# Patient Record
Sex: Female | Born: 1935 | Race: White | Hispanic: No | Smoking: Former smoker
Health system: Southern US, Community
[De-identification: ages and names within clinical notes are randomized; demographics above are authoritative.]

## PROBLEM LIST (undated history)

## (undated) DIAGNOSIS — K219 Gastro-esophageal reflux disease without esophagitis: Secondary | ICD-10-CM

## (undated) DIAGNOSIS — E079 Disorder of thyroid, unspecified: Secondary | ICD-10-CM

## (undated) HISTORY — PX: ABDOMINAL HYSTERECTOMY: SHX81

---

## 2000-06-02 ENCOUNTER — Encounter: Payer: Self-pay | Admitting: Internal Medicine

## 2000-06-02 ENCOUNTER — Ambulatory Visit (HOSPITAL_COMMUNITY): Admission: RE | Admit: 2000-06-02 | Discharge: 2000-06-02 | Payer: Self-pay | Admitting: Internal Medicine

## 2001-01-06 ENCOUNTER — Ambulatory Visit (HOSPITAL_COMMUNITY): Admission: RE | Admit: 2001-01-06 | Discharge: 2001-01-06 | Payer: Self-pay | Admitting: Ophthalmology

## 2002-02-02 ENCOUNTER — Other Ambulatory Visit: Admission: RE | Admit: 2002-02-02 | Discharge: 2002-02-02 | Payer: Self-pay | Admitting: Dermatology

## 2004-01-15 ENCOUNTER — Ambulatory Visit: Payer: Self-pay | Admitting: Internal Medicine

## 2004-02-12 ENCOUNTER — Ambulatory Visit (HOSPITAL_COMMUNITY): Admission: RE | Admit: 2004-02-12 | Discharge: 2004-02-12 | Payer: Self-pay | Admitting: Internal Medicine

## 2004-04-11 ENCOUNTER — Ambulatory Visit (HOSPITAL_COMMUNITY): Admission: RE | Admit: 2004-04-11 | Discharge: 2004-04-11 | Payer: Self-pay | Admitting: Internal Medicine

## 2004-04-24 ENCOUNTER — Ambulatory Visit (HOSPITAL_COMMUNITY): Admission: RE | Admit: 2004-04-24 | Discharge: 2004-04-24 | Payer: Self-pay | Admitting: Internal Medicine

## 2004-05-10 ENCOUNTER — Ambulatory Visit (HOSPITAL_COMMUNITY): Admission: RE | Admit: 2004-05-10 | Discharge: 2004-05-10 | Payer: Self-pay | Admitting: Internal Medicine

## 2004-05-10 ENCOUNTER — Ambulatory Visit: Payer: Self-pay | Admitting: Internal Medicine

## 2004-07-12 ENCOUNTER — Ambulatory Visit (HOSPITAL_COMMUNITY): Admission: RE | Admit: 2004-07-12 | Discharge: 2004-07-12 | Payer: Self-pay | Admitting: Pulmonary Disease

## 2004-07-25 ENCOUNTER — Ambulatory Visit: Payer: Self-pay | Admitting: Orthopedic Surgery

## 2004-08-28 ENCOUNTER — Ambulatory Visit: Payer: Self-pay | Admitting: Orthopedic Surgery

## 2004-09-19 ENCOUNTER — Ambulatory Visit: Payer: Self-pay | Admitting: Internal Medicine

## 2005-07-02 ENCOUNTER — Ambulatory Visit (HOSPITAL_COMMUNITY): Admission: RE | Admit: 2005-07-02 | Discharge: 2005-07-02 | Payer: Self-pay | Admitting: Internal Medicine

## 2005-10-23 ENCOUNTER — Ambulatory Visit: Payer: Self-pay | Admitting: Internal Medicine

## 2006-06-25 ENCOUNTER — Ambulatory Visit (HOSPITAL_COMMUNITY): Admission: RE | Admit: 2006-06-25 | Discharge: 2006-06-25 | Payer: Self-pay | Admitting: Internal Medicine

## 2006-06-29 ENCOUNTER — Ambulatory Visit: Payer: Self-pay | Admitting: Orthopedic Surgery

## 2006-08-11 ENCOUNTER — Ambulatory Visit: Payer: Self-pay | Admitting: Orthopedic Surgery

## 2006-10-20 ENCOUNTER — Ambulatory Visit: Payer: Self-pay | Admitting: Internal Medicine

## 2006-11-11 ENCOUNTER — Ambulatory Visit (HOSPITAL_COMMUNITY): Admission: RE | Admit: 2006-11-11 | Discharge: 2006-11-11 | Payer: Self-pay | Admitting: Internal Medicine

## 2006-11-11 ENCOUNTER — Ambulatory Visit: Payer: Self-pay | Admitting: Internal Medicine

## 2007-05-04 ENCOUNTER — Ambulatory Visit (HOSPITAL_COMMUNITY): Admission: RE | Admit: 2007-05-04 | Discharge: 2007-05-04 | Payer: Self-pay | Admitting: Internal Medicine

## 2007-10-22 ENCOUNTER — Ambulatory Visit: Payer: Self-pay | Admitting: Internal Medicine

## 2007-10-22 ENCOUNTER — Ambulatory Visit (HOSPITAL_COMMUNITY): Admission: RE | Admit: 2007-10-22 | Discharge: 2007-10-22 | Payer: Self-pay | Admitting: Internal Medicine

## 2008-07-04 ENCOUNTER — Ambulatory Visit (HOSPITAL_COMMUNITY): Admission: RE | Admit: 2008-07-04 | Discharge: 2008-07-04 | Payer: Self-pay | Admitting: Ophthalmology

## 2008-09-25 ENCOUNTER — Ambulatory Visit (HOSPITAL_COMMUNITY): Admission: RE | Admit: 2008-09-25 | Discharge: 2008-09-25 | Payer: Self-pay | Admitting: Internal Medicine

## 2008-10-15 ENCOUNTER — Inpatient Hospital Stay (HOSPITAL_COMMUNITY): Admission: EM | Admit: 2008-10-15 | Discharge: 2008-10-17 | Payer: Self-pay | Admitting: Emergency Medicine

## 2008-10-15 ENCOUNTER — Ambulatory Visit: Payer: Self-pay | Admitting: Family Medicine

## 2008-10-15 ENCOUNTER — Encounter (INDEPENDENT_AMBULATORY_CARE_PROVIDER_SITE_OTHER): Payer: Self-pay | Admitting: *Deleted

## 2008-10-15 LAB — CONVERTED CEMR LAB
Folate: >20 ng/mL
TSH: 7.96 microintl units/mL
Vitamin B-12: 1379 pg/mL

## 2008-10-16 ENCOUNTER — Encounter (INDEPENDENT_AMBULATORY_CARE_PROVIDER_SITE_OTHER): Payer: Self-pay | Admitting: *Deleted

## 2008-10-16 LAB — CONVERTED CEMR LAB
Albumin: 2.7 g/dL
Alkaline Phosphatase: 46 units/L
GFR calc non Af Amer: 56 mL/min
Glucose, Bld: 95 mg/dL
HCT: 29.4 %
Potassium: 3.6 meq/L
Sodium: 135 meq/L

## 2008-12-05 ENCOUNTER — Ambulatory Visit (HOSPITAL_COMMUNITY): Admission: RE | Admit: 2008-12-05 | Discharge: 2008-12-05 | Payer: Self-pay | Admitting: Internal Medicine

## 2008-12-05 ENCOUNTER — Encounter (INDEPENDENT_AMBULATORY_CARE_PROVIDER_SITE_OTHER): Payer: Self-pay | Admitting: *Deleted

## 2008-12-05 LAB — CONVERTED CEMR LAB
ALT: 15 units/L
BUN: 26 mg/dL
Creatinine, Ser: 1.05 mg/dL
Glucose, Bld: 82 mg/dL
Potassium: 4.3 meq/L
Sodium: 144 meq/L

## 2008-12-07 ENCOUNTER — Ambulatory Visit (HOSPITAL_COMMUNITY): Admission: RE | Admit: 2008-12-07 | Discharge: 2008-12-07 | Payer: Self-pay | Admitting: Internal Medicine

## 2008-12-11 ENCOUNTER — Encounter: Payer: Self-pay | Admitting: *Deleted

## 2008-12-11 DIAGNOSIS — E039 Hypothyroidism, unspecified: Secondary | ICD-10-CM | POA: Insufficient documentation

## 2008-12-11 DIAGNOSIS — K219 Gastro-esophageal reflux disease without esophagitis: Secondary | ICD-10-CM

## 2008-12-11 DIAGNOSIS — E785 Hyperlipidemia, unspecified: Secondary | ICD-10-CM

## 2008-12-14 ENCOUNTER — Encounter (INDEPENDENT_AMBULATORY_CARE_PROVIDER_SITE_OTHER): Payer: Self-pay | Admitting: *Deleted

## 2008-12-14 ENCOUNTER — Ambulatory Visit: Payer: Self-pay | Admitting: Cardiology

## 2008-12-14 DIAGNOSIS — Z8719 Personal history of other diseases of the digestive system: Secondary | ICD-10-CM

## 2008-12-14 DIAGNOSIS — K5730 Diverticulosis of large intestine without perforation or abscess without bleeding: Secondary | ICD-10-CM | POA: Insufficient documentation

## 2008-12-14 DIAGNOSIS — I951 Orthostatic hypotension: Secondary | ICD-10-CM

## 2008-12-14 DIAGNOSIS — R7881 Bacteremia: Secondary | ICD-10-CM

## 2008-12-14 DIAGNOSIS — N3090 Cystitis, unspecified without hematuria: Secondary | ICD-10-CM | POA: Insufficient documentation

## 2008-12-14 DIAGNOSIS — I4510 Unspecified right bundle-branch block: Secondary | ICD-10-CM

## 2008-12-15 ENCOUNTER — Ambulatory Visit: Payer: Self-pay | Admitting: Cardiology

## 2008-12-15 ENCOUNTER — Ambulatory Visit (HOSPITAL_COMMUNITY): Admission: RE | Admit: 2008-12-15 | Discharge: 2008-12-15 | Payer: Self-pay | Admitting: Cardiology

## 2008-12-15 ENCOUNTER — Encounter: Payer: Self-pay | Admitting: Cardiology

## 2008-12-18 ENCOUNTER — Ambulatory Visit: Payer: Self-pay | Admitting: Cardiology

## 2008-12-19 ENCOUNTER — Ambulatory Visit: Payer: Self-pay | Admitting: Cardiology

## 2008-12-27 ENCOUNTER — Encounter (INDEPENDENT_AMBULATORY_CARE_PROVIDER_SITE_OTHER): Payer: Self-pay | Admitting: *Deleted

## 2008-12-29 ENCOUNTER — Ambulatory Visit: Payer: Self-pay | Admitting: Cardiology

## 2008-12-29 ENCOUNTER — Encounter (INDEPENDENT_AMBULATORY_CARE_PROVIDER_SITE_OTHER): Payer: Self-pay | Admitting: *Deleted

## 2008-12-29 ENCOUNTER — Encounter: Payer: Self-pay | Admitting: Cardiology

## 2008-12-29 LAB — CONVERTED CEMR LAB
BUN: 22 mg/dL
BUN: 22 mg/dL (ref 6–23)
CO2: 26 meq/L
CO2: 26 meq/L
CO2: 26 meq/L (ref 19–32)
Calcium: 9.2 mg/dL
Chloride: 106 meq/L
Chloride: 106 meq/L (ref 96–112)
Creatinine, Ser: 1.13 mg/dL
Creatinine, Ser: 1.13 mg/dL
Creatinine, Ser: 1.13 mg/dL (ref 0.40–1.20)
Glucose, Bld: 79 mg/dL
Glucose, Bld: 79 mg/dL (ref 70–99)
Sodium: 139 meq/L

## 2009-01-01 ENCOUNTER — Encounter (INDEPENDENT_AMBULATORY_CARE_PROVIDER_SITE_OTHER): Payer: Self-pay | Admitting: *Deleted

## 2009-01-03 ENCOUNTER — Encounter: Payer: Self-pay | Admitting: Internal Medicine

## 2009-01-08 ENCOUNTER — Encounter (INDEPENDENT_AMBULATORY_CARE_PROVIDER_SITE_OTHER): Payer: Self-pay | Admitting: *Deleted

## 2009-01-12 ENCOUNTER — Ambulatory Visit: Payer: Self-pay | Admitting: Adult Health

## 2009-01-12 DIAGNOSIS — R002 Palpitations: Secondary | ICD-10-CM

## 2009-01-17 ENCOUNTER — Ambulatory Visit: Payer: Self-pay | Admitting: Cardiology

## 2009-01-20 ENCOUNTER — Encounter: Payer: Self-pay | Admitting: Cardiology

## 2009-01-25 ENCOUNTER — Ambulatory Visit: Payer: Self-pay | Admitting: Cardiology

## 2009-01-25 ENCOUNTER — Encounter (INDEPENDENT_AMBULATORY_CARE_PROVIDER_SITE_OTHER): Payer: Self-pay | Admitting: *Deleted

## 2009-01-30 ENCOUNTER — Inpatient Hospital Stay (HOSPITAL_COMMUNITY): Admission: EM | Admit: 2009-01-30 | Discharge: 2009-02-02 | Payer: Self-pay | Admitting: Emergency Medicine

## 2009-02-08 ENCOUNTER — Encounter: Payer: Self-pay | Admitting: Internal Medicine

## 2009-02-09 ENCOUNTER — Ambulatory Visit: Payer: Self-pay | Admitting: Cardiology

## 2009-05-15 ENCOUNTER — Ambulatory Visit (HOSPITAL_COMMUNITY): Admission: RE | Admit: 2009-05-15 | Discharge: 2009-05-15 | Payer: Self-pay | Admitting: Internal Medicine

## 2009-10-21 ENCOUNTER — Inpatient Hospital Stay (HOSPITAL_COMMUNITY): Admission: EM | Admit: 2009-10-21 | Discharge: 2009-10-26 | Payer: Self-pay | Admitting: Emergency Medicine

## 2009-10-23 HISTORY — PX: HIP SURGERY: SHX245

## 2009-10-26 ENCOUNTER — Inpatient Hospital Stay: Admission: AD | Admit: 2009-10-26 | Discharge: 2009-11-07 | Payer: Self-pay | Admitting: Internal Medicine

## 2010-01-01 ENCOUNTER — Ambulatory Visit (HOSPITAL_COMMUNITY): Payer: Self-pay | Admitting: Internal Medicine

## 2010-01-01 ENCOUNTER — Encounter (HOSPITAL_COMMUNITY)
Admission: RE | Admit: 2010-01-01 | Discharge: 2010-01-31 | Payer: Self-pay | Source: Home / Self Care | Attending: Internal Medicine | Admitting: Internal Medicine

## 2010-02-17 ENCOUNTER — Encounter: Payer: Self-pay | Admitting: Internal Medicine

## 2010-02-26 NOTE — Letter (Signed)
Summary: OFFICE NOTE/[facility]  OFFICE NOTE/[facility]   Imported By: Diana Eves 02/08/2009 09:36:40  _____________________________________________________________________  External Attachment:    Type:   Image     Comment:   External Document

## 2010-02-26 NOTE — Procedures (Signed)
Summary: [facility]   Imported By: Faythe Ghee 02/14/2009 12:25:52  _____________________________________________________________________  External Attachment:    Type:   Image     Comment:   External Document

## 2010-04-02 ENCOUNTER — Encounter (HOSPITAL_COMMUNITY): Payer: Medicare Other | Attending: Internal Medicine

## 2010-04-02 ENCOUNTER — Ambulatory Visit (HOSPITAL_COMMUNITY): Payer: Medicare Other

## 2010-04-02 DIAGNOSIS — M818 Other osteoporosis without current pathological fracture: Secondary | ICD-10-CM | POA: Insufficient documentation

## 2010-04-11 LAB — BASIC METABOLIC PANEL
BUN: 13 mg/dL (ref 6–23)
CO2: 22 mEq/L (ref 19–32)
CO2: 24 mEq/L (ref 19–32)
CO2: 25 mEq/L (ref 19–32)
Calcium: 8.3 mg/dL — ABNORMAL LOW (ref 8.4–10.5)
Calcium: 8.5 mg/dL (ref 8.4–10.5)
Calcium: 8.7 mg/dL (ref 8.4–10.5)
Chloride: 107 mEq/L (ref 96–112)
Chloride: 109 mEq/L (ref 96–112)
Creatinine, Ser: 0.87 mg/dL (ref 0.4–1.2)
Creatinine, Ser: 0.93 mg/dL (ref 0.4–1.2)
Creatinine, Ser: 1.09 mg/dL (ref 0.4–1.2)
GFR calc Af Amer: >60 mL/min (ref >60)
GFR calc Af Amer: >60 mL/min (ref >60)
GFR calc Af Amer: >60 mL/min (ref >60)
GFR calc non Af Amer: 48 mL/min — ABNORMAL LOW (ref >60)
GFR calc non Af Amer: 58 mL/min — ABNORMAL LOW (ref >60)
GFR calc non Af Amer: >60 mL/min (ref >60)
GFR calc non Af Amer: >60 mL/min (ref >60)
Glucose, Bld: 113 mg/dL — ABNORMAL HIGH (ref 70–99)
Glucose, Bld: 117 mg/dL — ABNORMAL HIGH (ref 70–99)
Potassium: 3.7 mEq/L (ref 3.5–5.1)
Potassium: 3.8 mEq/L (ref 3.5–5.1)
Potassium: 3.8 mEq/L (ref 3.5–5.1)
Sodium: 132 mEq/L — ABNORMAL LOW (ref 135–145)
Sodium: 132 mEq/L — ABNORMAL LOW (ref 135–145)
Sodium: 135 mEq/L (ref 135–145)
Sodium: 135 mEq/L (ref 135–145)

## 2010-04-11 LAB — CBC
HCT: 26.5 % — ABNORMAL LOW (ref 36.0–46.0)
HCT: 26.7 % — ABNORMAL LOW (ref 36.0–46.0)
Hemoglobin: 11.4 g/dL — ABNORMAL LOW (ref 12.0–15.0)
Hemoglobin: 9 g/dL — ABNORMAL LOW (ref 12.0–15.0)
Hemoglobin: 9.1 g/dL — ABNORMAL LOW (ref 12.0–15.0)
Hemoglobin: 9.5 g/dL — ABNORMAL LOW (ref 12.0–15.0)
MCH: 30.1 pg (ref 26.0–34.0)
MCHC: 34.1 g/dL (ref 30.0–36.0)
MCV: 88.4 fL (ref 78.0–100.0)
MCV: 88.9 fL (ref 78.0–100.0)
Platelets: 125 10*3/uL — ABNORMAL LOW (ref 150–400)
Platelets: 127 10*3/uL — ABNORMAL LOW (ref 150–400)
Platelets: 147 10*3/uL — ABNORMAL LOW (ref 150–400)
Platelets: 179 10*3/uL (ref 150–400)
RBC: 2.82 MIL/uL — ABNORMAL LOW (ref 3.87–5.11)
RBC: 3.02 MIL/uL — ABNORMAL LOW (ref 3.87–5.11)
RBC: 3.17 MIL/uL — ABNORMAL LOW (ref 3.87–5.11)
RBC: 3.78 MIL/uL — ABNORMAL LOW (ref 3.87–5.11)
RDW: 12.7 % (ref 11.5–15.5)
RDW: 13.5 % (ref 11.5–15.5)
WBC: 10.9 10*3/uL — ABNORMAL HIGH (ref 4.0–10.5)
WBC: 11.1 10*3/uL — ABNORMAL HIGH (ref 4.0–10.5)
WBC: 11.4 10*3/uL — ABNORMAL HIGH (ref 4.0–10.5)
WBC: 8.5 10*3/uL (ref 4.0–10.5)
WBC: 9.3 10*3/uL (ref 4.0–10.5)

## 2010-04-11 LAB — IRON AND TIBC: Saturation Ratios: 8 % — ABNORMAL LOW (ref 20–55)

## 2010-04-11 LAB — PROTIME-INR
INR: 1.18 (ref 0.00–1.49)
INR: 1.36 (ref 0.00–1.49)
INR: 1.41 (ref 0.00–1.49)
Prothrombin Time: 15.2 seconds (ref 11.6–15.2)
Prothrombin Time: 17 seconds — ABNORMAL HIGH (ref 11.6–15.2)

## 2010-04-11 LAB — CROSSMATCH
ABO/RH(D): A POS
Antibody Screen: NEGATIVE

## 2010-04-11 LAB — URINALYSIS, ROUTINE W REFLEX MICROSCOPIC
Bilirubin Urine: NEGATIVE
Glucose, UA: NEGATIVE mg/dL
Hgb urine dipstick: NEGATIVE
Protein, ur: NEGATIVE mg/dL
Specific Gravity, Urine: 1.012 (ref 1.005–1.030)
Urobilinogen, UA: 0.2 mg/dL (ref 0.0–1.0)

## 2010-04-11 LAB — URINE CULTURE
Colony Count: NO GROWTH
Culture: NO GROWTH

## 2010-04-11 LAB — COMPREHENSIVE METABOLIC PANEL
AST: 41 U/L — ABNORMAL HIGH (ref 0–37)
Albumin: 3.6 g/dL (ref 3.5–5.2)
Alkaline Phosphatase: 65 U/L (ref 39–117)
BUN: 20 mg/dL (ref 6–23)
Chloride: 103 mEq/L (ref 96–112)
Potassium: 4.1 mEq/L (ref 3.5–5.1)
Total Bilirubin: 0.3 mg/dL (ref 0.3–1.2)

## 2010-04-11 LAB — DIFFERENTIAL
Basophils Absolute: 0 10*3/uL (ref 0.0–0.1)
Basophils Relative: 0 % (ref 0–1)
Eosinophils Relative: 3 % (ref 0–5)
Monocytes Absolute: 0.8 10*3/uL (ref 0.1–1.0)
Neutro Abs: 6 10*3/uL (ref 1.7–7.7)

## 2010-04-11 LAB — RETICULOCYTES
RBC.: 3.69 MIL/uL — ABNORMAL LOW (ref 3.87–5.11)
Retic Count, Absolute: 29.5 10*3/uL (ref 19.0–186.0)
Retic Ct Pct: 0.8 % (ref 0.4–3.1)

## 2010-04-11 LAB — FOLATE: Folate: 16.6 ng/mL

## 2010-04-11 LAB — TYPE AND SCREEN: Antibody Screen: NEGATIVE

## 2010-04-14 LAB — COMPREHENSIVE METABOLIC PANEL
AST: 20 U/L (ref 0–37)
Albumin: 3.5 g/dL (ref 3.5–5.2)
Alkaline Phosphatase: 47 U/L (ref 39–117)
BUN: 15 mg/dL (ref 6–23)
Chloride: 102 mEq/L (ref 96–112)
GFR calc Af Amer: >60 mL/min (ref >60)
Potassium: 4 mEq/L (ref 3.5–5.1)
Sodium: 135 mEq/L (ref 135–145)
Total Bilirubin: 0.8 mg/dL (ref 0.3–1.2)
Total Protein: 7 g/dL (ref 6.0–8.3)

## 2010-04-14 LAB — POCT CARDIAC MARKERS
Myoglobin, poc: 54.7 ng/mL (ref 12–200)
Troponin i, poc: <0.05 ng/mL (ref 0.00–0.09)

## 2010-04-14 LAB — URINALYSIS, ROUTINE W REFLEX MICROSCOPIC
Glucose, UA: NEGATIVE mg/dL
Leukocytes, UA: NEGATIVE
Protein, ur: NEGATIVE mg/dL
Specific Gravity, Urine: 1.01 (ref 1.005–1.030)
pH: 7 (ref 5.0–8.0)

## 2010-04-14 LAB — VITAMIN B12: Vitamin B-12: 531 pg/mL (ref 211–911)

## 2010-04-14 LAB — CULTURE, BLOOD (ROUTINE X 2)
Report Status: 1092011
Report Status: 1092011

## 2010-04-14 LAB — DIFFERENTIAL
Basophils Absolute: 0 10*3/uL (ref 0.0–0.1)
Basophils Relative: 0 % (ref 0–1)
Eosinophils Relative: 0 % (ref 0–5)
Monocytes Absolute: 0.5 10*3/uL (ref 0.1–1.0)
Monocytes Relative: 5 % (ref 3–12)
Neutro Abs: 8.1 10*3/uL — ABNORMAL HIGH (ref 1.7–7.7)

## 2010-04-14 LAB — CBC
Platelets: 183 10*3/uL (ref 150–400)
RDW: 13.7 % (ref 11.5–15.5)
WBC: 9.7 10*3/uL (ref 4.0–10.5)

## 2010-04-14 LAB — URINE MICROSCOPIC-ADD ON

## 2010-05-03 LAB — URINALYSIS, ROUTINE W REFLEX MICROSCOPIC
Protein, ur: NEGATIVE mg/dL
Specific Gravity, Urine: 1.01 (ref 1.005–1.030)
Urobilinogen, UA: 0.2 mg/dL (ref 0.0–1.0)

## 2010-05-03 LAB — CBC
HCT: 29.4 % — ABNORMAL LOW (ref 36.0–46.0)
HCT: 34.1 % — ABNORMAL LOW (ref 36.0–46.0)
Hemoglobin: 11.4 g/dL — ABNORMAL LOW (ref 12.0–15.0)
Hemoglobin: 9.8 g/dL — ABNORMAL LOW (ref 12.0–15.0)
MCHC: 33.3 g/dL (ref 30.0–36.0)
MCV: 87.7 fL (ref 78.0–100.0)
RDW: 13.4 % (ref 11.5–15.5)
RDW: 13.6 % (ref 11.5–15.5)
WBC: 9.4 10*3/uL (ref 4.0–10.5)

## 2010-05-03 LAB — COMPREHENSIVE METABOLIC PANEL
Alkaline Phosphatase: 46 U/L (ref 39–117)
Alkaline Phosphatase: 64 U/L (ref 39–117)
BUN: 11 mg/dL (ref 6–23)
BUN: 15 mg/dL (ref 6–23)
Calcium: 9.2 mg/dL (ref 8.4–10.5)
Chloride: 106 mEq/L (ref 96–112)
Creatinine, Ser: 0.96 mg/dL (ref 0.4–1.2)
Glucose, Bld: 82 mg/dL (ref 70–99)
Glucose, Bld: 95 mg/dL (ref 70–99)
Potassium: 3.6 mEq/L (ref 3.5–5.1)
Potassium: 4.1 mEq/L (ref 3.5–5.1)
Total Bilirubin: 0.7 mg/dL (ref 0.3–1.2)
Total Protein: 6.9 g/dL (ref 6.0–8.3)

## 2010-05-03 LAB — FOLATE: Folate: >20 ng/mL

## 2010-05-03 LAB — DIFFERENTIAL
Basophils Relative: 0 % (ref 0–1)
Lymphocytes Relative: 12 % (ref 12–46)
Monocytes Relative: 7 % (ref 3–12)
Neutro Abs: 8.6 10*3/uL — ABNORMAL HIGH (ref 1.7–7.7)
Neutrophils Relative %: 78 % — ABNORMAL HIGH (ref 43–77)

## 2010-05-03 LAB — CULTURE, BLOOD (ROUTINE X 2): Culture: NO GROWTH

## 2010-05-03 LAB — URINE CULTURE

## 2010-05-03 LAB — TSH: TSH: 7.96 u[IU]/mL — ABNORMAL HIGH (ref 0.350–4.500)

## 2010-06-11 NOTE — Assessment & Plan Note (Signed)
NAMEMarland Kitchen  Curtis, Bonnie                   CHART#:  [id number]   DATE:  10/22/2007                       DOB:  [birth year]   CHIEF COMPLAINT:  Annual followup.   PROBLEM LIST:  1. Diverticulosis.  2. Chronic constipation that alternates with diarrhea (irritable bowel      syndrome).  3. Gastroesophageal reflux disease.  4. Hypothyroidism.  5. Status post hysterectomy.  6. Last colonoscopy by Dr. Jena Gauss on 11/11/2006, internal hemorrhoids      and left-sided diverticula.  7. Family history of colon cancer.   SUBJECTIVE:  The patient is an 77 year old Caucasian female.  She notes  that she has been doing well as far as her bowels are concerned.  She  does occasionally have constipation or diarrhea, but more recently she  has been very regular.  Denies rectal bleeding or melena.  She is taking  omeprazole every morning.  She denies any heartburn or indigestion.  She  has noticed increased belching.  She does have some shortness of breath  on exertion for which she is scheduled an appointment with Dr. Ouida Sills  today.  She denies any abdominal pain.  Denies any cough.  She  occasionally has palpitations with the pain.  Denies any diaphoresis  with the pain.  She tells me years ago she was diagnosed with biliary  dyskinesia, but never had a cholecystectomy.   CURRENT MEDICATIONS:  See the list from 10/22/2007.   ALLERGIES:  No known drug allergies.   OBJECTIVE:  VITAL SIGNS:  Weight 169 pounds, height 67 inches,  temperature 98 degrees, blood pressure 118/68, and pulse 72.  GENERAL:  The patient is a well-developed and well-nourished elderly  Caucasian female, in no acute distress.  HEENT:  Sclerae clear and nonicteric.  Conjunctivae pink.  Oropharynx  pink and moist without any lesions.  NECK:  Supple without mass or thyromegaly.  CHEST:  Heart regular rate and rhythm.  Normal S1 and S2 without  murmurs, clicks, rubs, or gallops.  ABDOMEN:  Positive bowel sounds x4.  No bruits auscultated.   Soft,  nontender, and nondistended without palpable mass or hepatosplenomegaly.  No rebound, tenderness, or guarding.  EXTREMITIES:  Without clubbing or edema.   ASSESSMENT:  The patient is an 77 year old Caucasian female with  irritable bowel syndrome well controlled at this time and chronic  gastroesophageal reflux disease well controlled except for increased  belching.  She is also having some worrisome shortness of breath and is  good that she is set up an appointment up today to discuss this further  with Dr. Ouida Sills.  I do not suspect that this is due to refractory  gastroesophageal reflux disease and feels she needs further workup.   PLAN:  1. She is going to try increasing her omeprazole to 20 mg b.i.d. for 1      week and call me next week to see if this makes any difference in      her increased belching.  2. She is going to follow with Dr. Ouida Sills for appointment day regarding      her shortness of breath on exertion and palpitations.  3. She is going to call with a progress report in 1 week and will go      from there.  If she is doing well, she will  not need a follow up      for another 2 years.       Bonnie Curtis, N.P.  Electronically Signed     R. Roetta Sessions, M.D.  Electronically Signed    KJ/MEDQ  D:  10/22/2007  T:  10/22/2007  Job:  [id number]   cc:   Kingsley Callander. Ouida Sills, MD

## 2010-06-11 NOTE — Assessment & Plan Note (Signed)
NAMEMarland Kitchen  Bonnie Curtis, Bonnie Curtis                   CHART#:  [id number]   DATE:  10/20/2006                       DOB:  02-Dec-1924   CHIEF COMPLAINT:  Irritable bowel syndrome, positive family history of  colon cancer in first degree relative.   HISTORY OF PRESENT ILLNESS:  The patient is a pleasant, 77 year old  Caucasian female followed primarily by Dr. Carylon Perches.  She has a long  history of GERD and irritable bowel symptoms.  She has positive family  history of colon cancer in brother, history of cancer and some colonic  polyps per her report (ultimately died of lung cancer).  The patient  last had a colonoscopy back in 2003 where she was found to have internal  hemorrhoids and diverticulosis.  She has intermittent bloating and  diarrhea.  Has not had any blood per rectum.  Does not always feel she  evacuates very well.  Started taking over the counter fiber supplement  daily.  She does take stool softeners p.r.n.  She also takes omeprazole  20 mg orally daily which has a long track record of controlling her  symptoms of reflux very well.  She is not having any solid food or pill  dysphagia.  She was last seen 1 year ago.  Her weight is actually up 6  pounds from what it was 1 year ago.  She saw Dr. Ouida Sills for a physical  earlier this year and she said everything checked out okay.   PAST MEDICAL HISTORY:  Gastroesophageal reflux disease, hyperlipidemia,  history of diverticulosis, hypothyroidism.   PAST SURGICAL HISTORY:  Hysterectomy, prior colonoscopy, prior EGD.   CURRENT MEDICATIONS:  1. Over the counter fiber supplement daily.  2. Levoxyl 75 mcg daily.  3. Calcium supplement.  4. Ecotrin 1 daily.  5. Lipitor 10 mg daily.  6. Amitriptyline 25 mg at bedtime.  7. Omeprazole 20 mg daily.  8. Stool softener p.r.n.   ALLERGIES:  No known drug allergies.   FAMILY HISTORY:  Significant as outlined above.   SOCIAL HISTORY:  The patient is widowed.  She has 1 daughter.  She is  retired.  No  alcohol or tobacco.   REVIEW OF SYSTEMS:  No recent chest pain, dyspnea on exertion.  Some  weight gain as outlined above.  No nausea or vomiting.  She denies frank  abdominal pain.   PHYSICAL EXAMINATION:  GENERAL:  Pleasant 77 year old lady resting  comfortably.  VITAL SIGNS:  Weight 178, height 5 foot 7 inches, temperature 98.2, BP  110/70, pulse 72.  SKIN:  Warm and dry.  There is no jaundice indicative of chronic liver  disease.  HEENT:  No scleral icterus.  Conjunctivae are pink.  CHEST:  Lungs are clear to auscultation.  HEART:  Regular rate and rhythm without murmurs, gallops or rubs.  ABDOMEN:  Nondistended, positive bowel sounds, soft, nontender without  appreciable mass or organomegaly.  EXTREMITIES:  No edema.  RECTAL:  Deferred to time of colonoscopy.   IMPRESSION:  The patient is a very pleasant, 77 year old lady with well  controlled gastroesophageal reflux disease who has had chronic symptoms  most consistent with irritable bowel syndrome and intermittent bloating  and diarrhea and she has known diverticulosis.  She does take a fiber  supplement more or less on a regular basis.  She  is actually rarely  constipated.  She does take a stool softener.  Positive family history  of colon cancer in first degree relative (malignant polyps).   RECOMMENDATIONS:  I have offered the patient a screening colonoscopy at  this time.  Potential risks, benefits, and alternatives have been  reviewed.  Will likely readjust her bowel regimen depending on finding  of colonoscopy.  Her questions were answered, she is agreeable.  Will  make further recommendations in the very near future.       Jonathon Bellows, M.D.  Electronically Signed     RMR/MEDQ  D:  10/20/2006  T:  10/20/2006  Job:  [id number]   cc:   Kingsley Callander. Ouida Sills, MD

## 2010-06-11 NOTE — Op Note (Signed)
NAMEHARLOW, BASLEY                  ACCOUNT NO.:  [id number]   MEDICAL RECORD NO.:  [id number]          PATIENT TYPE:  AMB   LOCATION:  DAY                           FACILITY:  [facility]   PHYSICIAN:  R. Roetta Sessions, M.D. DATE OF BIRTH:  Dec 29, 1924   DATE OF PROCEDURE:  11/11/2006  DATE OF DISCHARGE:                               OPERATIVE REPORT   PROCEDURE:  Screening colonoscopy.   INDICATIONS FOR PROCEDURE:  An 77 year old lady with a positive family  history of colon cancer in first-degree relative, here for screening  colonoscopy.  Last colonoscopy was in 2003.  She has intermittent  postprandial abdominal bloating and diarrhea and occasional  constipation.  She has known diverticulosis.  Colonoscopy is now being  done as screening maneuver.  This approach has been discussed with the  patient at length.  Potential risks, benefits and alternatives have been  reviewed, questions answered.  She is agreeable.  Please see  documentation in the medical record.   PROCEDURE NOTE:  O2 saturation, blood pressure, pulse and respirations  were monitored for the entire procedure.  Conscious sedation with Versed  3 mg IV, Demerol 25 mg IV.  Instrument:  Pentax video chip system.   FINDINGS:  Digital rectal exam revealed no abnormalities. The scope was  placed, unfortunately, prep was marginal to poor with vegetable matter  and semi-formed liquid stool throughout the colon which made the exam  much more difficult.  Colon:  Colonic mucosa was surveyed from  rectosigmoid junction through the left transverse right colon to the  appendiceal orifice, ileocecal valve, and cecum.  These structures were  well seen and photographed for the record.  From this level, scope was  slowly withdrawn.  All previous mentioned mucosal surfaces were again  seen.  The patient had a long capacious colon, which was redundant and a  number of maneuvers were required including changing the patient's  position,  external abdominal pressure to reached cecum.  Prep was  suboptimal.  It took quite a while, copiously washing the lumen of the  colon and suctioning out stool.  The patient has extensive left-sided  diverticula.  However, the remainder of colonic mucosa appeared normal.  It is possible small lesion may not have been seen because of the poor  prep.  The scope was pulled down to the rectum with examination of the  rectal mucosa, including retroflexed view of anal verge which  demonstrated only internal hemorrhoids.  The patient tolerated the  procedure well and was reactive after endoscopy.   IMPRESSION:  1. Internal hemorrhoids, otherwise normal rectum.  2. Redundant, elongated colon, extensive left-sided diverticula.      Remainder of colonic mucosa appeared normal although poor prep      compromised exam.   RECOMMENDATIONS:  1. Continue daily fiber supplements.  2. Would add a probiotic in the way of Flora-Q, Florajen or Align      daily to combat occasional diarrhea and bloating.  3. Will plan to see this nice lady back in the office in 1 year.  Jonathon Bellows, M.D.  Electronically Signed     RMR/MEDQ  D:  11/11/2006  T:  11/12/2006  Job:  [id number]   cc:   Kingsley Callander. Ouida Sills, MD  Fax: [phone]

## 2010-06-14 NOTE — Op Note (Signed)
Bonnie Curtis, Bonnie Curtis                  ACCOUNT NO.:  [id number]   MEDICAL RECORD NO.:  [id number]          PATIENT TYPE:  AMB   LOCATION:  DAY                           FACILITY:  [facility]   PHYSICIAN:  R. Roetta Sessions, M.D. DATE OF BIRTH:  Apr 25, 1924   DATE OF PROCEDURE:  05/10/2004  DATE OF DISCHARGE:                                 OPERATIVE REPORT   PROCEDURE:  EGD with Elease Hashimoto dilation.   INDICATIONS FOR PROCEDURE:  The patient is a 77 year old lady with  longstanding gastroesophageal reflux disease with breakthrough symptoms  despite taking Protonix 40 mg orally daily.  The patient had recent episodes  of what sounds like esophageal food impaction with solid food.  EGD is now  being done.  This approach has been discussed with the patient at length.  Potential risks, benefits, and alternatives have been reviewed, questions  answered.  Please see documentation in medical record.   PROCEDURE NOTE:  O2 saturations, blood pressure, pulse, and respirations  were monitored throughout the entire procedure.   CONSCIOUS SEDATION:  1.  Versed 3 mg IV.  2.  Demerol 75 mg IV in divided doses.   INSTRUMENT:  Olympus video chip system, Cetacaine spray for topical  oropharyngeal anesthesia.   FINDINGS:  Examination of the tubular esophagus revealed circumferential  distal esophageal erosions at EG junction superimposed on a Schatzki's ring.  There was no Barrett's esophagus or other esophageal mucosal abnormality.  EG junction easily traversed.   STOMACH:  The gastric cavity was emptied, insufflated well with air.  Thorough examination of the gastric mucosa, including retroflexed view of  the proximal stomach and esophagogastric junction demonstrated a moderate-  size hiatal hernia.  Otherwise, mucosa appeared normal.  Pylorus patent and  easily traversed.  Examination of the bulb and second portion revealed no  abnormalities.   THERAPY/DIAGNOSTIC MANEUVERS PERFORMED:  A 56 French Maloney  dilator was  passed to full insertion with ease.  Subsequently, a 35 French Maloney  dilator was passed to full insertion with ease.  Look back revealed the ring  remained intact.  Subsequently, four-corner bites of the ring taken with  biopsy forceps to disrupt, and this was done without difficulty.  The  patient tolerated the procedure well and was reacted in endoscopy.   IMPRESSION:  1.  Circumferential distal esophageal erosions consistent with erosive      reflux esophagitis, Schatzki's ring, status post dilation and disruption      as described above.  2.  Moderate-size hiatal hernia, otherwise normal stomach, D1 and D2.   RECOMMENDATIONS:  1.  Protonix once daily is not getting the job done; therefore, recommend      increasing Protonix to 40 mg orally twice daily, i.e., before breakfast      and supper for the next 3 months.  2.  I plan to see this nice lady back in our office in 3 months to assess      her progress.      RMR/MEDQ  D:  05/10/2004  T:  05/10/2004  Job:  [id number]   cc:  Kingsley Callander. Ouida Sills, MD  [address]  [place]  [place] [address]  Fax: [phone]

## 2010-06-14 NOTE — Procedures (Signed)
NAMEJOVANA, Bonnie Curtis                  ACCOUNT NO.:  [id number]   MEDICAL RECORD NO.:  [id number]          PATIENT TYPE:  OUT   LOCATION:  RESP                          FACILITY:  [facility]   PHYSICIAN:  Edward L. Juanetta Gosling, M.D.DATE OF BIRTH:  May 11, 1924   DATE OF PROCEDURE:  DATE OF DISCHARGE:  04/24/2004                              PULMONARY FUNCTION TEST   RESULTS:  Spirometry is normal.  The flow volume shows evidence of what  appears to be a cough.      ELH/MEDQ  D:  04/27/2004  T:  04/27/2004  Job:  [id number]   cc:   Kingsley Callander. Ouida Sills, MD  [address]  [place]  [place] [address]  Fax: [phone]

## 2010-06-26 ENCOUNTER — Ambulatory Visit (HOSPITAL_COMMUNITY)
Admission: RE | Admit: 2010-06-26 | Discharge: 2010-06-26 | Disposition: A | Discharge status: Home / Self Care | Payer: Medicare Other | Source: Ambulatory Visit | Attending: Pulmonary Disease | Admitting: Pulmonary Disease

## 2010-06-26 ENCOUNTER — Other Ambulatory Visit (HOSPITAL_COMMUNITY): Payer: Self-pay | Admitting: Pulmonary Disease

## 2010-06-26 DIAGNOSIS — I501 Left ventricular failure: Secondary | ICD-10-CM | POA: Insufficient documentation

## 2010-06-26 DIAGNOSIS — K449 Diaphragmatic hernia without obstruction or gangrene: Secondary | ICD-10-CM | POA: Insufficient documentation

## 2010-06-26 DIAGNOSIS — J438 Other emphysema: Secondary | ICD-10-CM | POA: Insufficient documentation

## 2010-06-26 DIAGNOSIS — R0609 Other forms of dyspnea: Secondary | ICD-10-CM | POA: Insufficient documentation

## 2010-06-26 DIAGNOSIS — R609 Edema, unspecified: Secondary | ICD-10-CM | POA: Insufficient documentation

## 2010-06-26 DIAGNOSIS — I10 Essential (primary) hypertension: Secondary | ICD-10-CM | POA: Insufficient documentation

## 2010-06-26 DIAGNOSIS — R0989 Other specified symptoms and signs involving the circulatory and respiratory systems: Secondary | ICD-10-CM | POA: Insufficient documentation

## 2010-06-26 DIAGNOSIS — I059 Rheumatic mitral valve disease, unspecified: Secondary | ICD-10-CM

## 2010-07-02 ENCOUNTER — Ambulatory Visit (HOSPITAL_COMMUNITY): Payer: Medicare Other

## 2010-09-05 ENCOUNTER — Ambulatory Visit: Payer: Medicare Other | Admitting: Orthopedic Surgery

## 2010-09-20 ENCOUNTER — Inpatient Hospital Stay (HOSPITAL_COMMUNITY)
Admission: EM | Admit: 2010-09-20 | Discharge: 2010-09-24 | DRG: 690 | Disposition: A | Discharge status: Home Health | Payer: Medicare Other | Attending: Internal Medicine | Admitting: Internal Medicine

## 2010-09-20 ENCOUNTER — Emergency Department (HOSPITAL_COMMUNITY): Payer: Medicare Other

## 2010-09-20 ENCOUNTER — Other Ambulatory Visit: Payer: Self-pay

## 2010-09-20 ENCOUNTER — Encounter: Payer: Self-pay | Admitting: Emergency Medicine

## 2010-09-20 DIAGNOSIS — IMO0002 Reserved for concepts with insufficient information to code with codable children: Secondary | ICD-10-CM

## 2010-09-20 DIAGNOSIS — N390 Urinary tract infection, site not specified: Principal | ICD-10-CM | POA: Diagnosis present

## 2010-09-20 DIAGNOSIS — E871 Hypo-osmolality and hyponatremia: Secondary | ICD-10-CM

## 2010-09-20 DIAGNOSIS — R7881 Bacteremia: Secondary | ICD-10-CM

## 2010-09-20 DIAGNOSIS — R509 Fever, unspecified: Secondary | ICD-10-CM

## 2010-09-20 DIAGNOSIS — K5730 Diverticulosis of large intestine without perforation or abscess without bleeding: Secondary | ICD-10-CM

## 2010-09-20 DIAGNOSIS — I951 Orthostatic hypotension: Secondary | ICD-10-CM

## 2010-09-20 DIAGNOSIS — E039 Hypothyroidism, unspecified: Secondary | ICD-10-CM

## 2010-09-20 DIAGNOSIS — Z8719 Personal history of other diseases of the digestive system: Secondary | ICD-10-CM

## 2010-09-20 DIAGNOSIS — K754 Autoimmune hepatitis: Secondary | ICD-10-CM | POA: Diagnosis present

## 2010-09-20 DIAGNOSIS — N3090 Cystitis, unspecified without hematuria: Secondary | ICD-10-CM

## 2010-09-20 DIAGNOSIS — R002 Palpitations: Secondary | ICD-10-CM

## 2010-09-20 DIAGNOSIS — I4510 Unspecified right bundle-branch block: Secondary | ICD-10-CM

## 2010-09-20 DIAGNOSIS — R55 Syncope and collapse: Secondary | ICD-10-CM

## 2010-09-20 DIAGNOSIS — K219 Gastro-esophageal reflux disease without esophagitis: Secondary | ICD-10-CM

## 2010-09-20 DIAGNOSIS — E785 Hyperlipidemia, unspecified: Secondary | ICD-10-CM

## 2010-09-20 DIAGNOSIS — E860 Dehydration: Secondary | ICD-10-CM | POA: Diagnosis present

## 2010-09-20 HISTORY — DX: Disorder of thyroid, unspecified: E07.9

## 2010-09-20 HISTORY — DX: Gastro-esophageal reflux disease without esophagitis: K21.9

## 2010-09-20 LAB — BASIC METABOLIC PANEL
BUN: 25 mg/dL — ABNORMAL HIGH (ref 6–23)
Calcium: 8.6 mg/dL (ref 8.4–10.5)
GFR calc Af Amer: 53 mL/min — ABNORMAL LOW (ref >60)
GFR calc non Af Amer: 44 mL/min — ABNORMAL LOW (ref >60)
Glucose, Bld: 109 mg/dL — ABNORMAL HIGH (ref 70–99)
Potassium: 3 mEq/L — ABNORMAL LOW (ref 3.5–5.1)

## 2010-09-20 LAB — CBC
HCT: 35.2 % — ABNORMAL LOW (ref 36.0–46.0)
RBC: 3.97 MIL/uL (ref 3.87–5.11)
RDW: 14.5 % (ref 11.5–15.5)
WBC: 8.4 10*3/uL (ref 4.0–10.5)

## 2010-09-20 LAB — DIFFERENTIAL
Lymphocytes Relative: 2 % — ABNORMAL LOW (ref 12–46)
Lymphs Abs: 0.2 10*3/uL — ABNORMAL LOW (ref 0.7–4.0)
Monocytes Absolute: 0.3 10*3/uL (ref 0.1–1.0)
Neutro Abs: 7.9 10*3/uL — ABNORMAL HIGH (ref 1.7–7.7)

## 2010-09-20 LAB — CARDIAC PANEL(CRET KIN+CKTOT+MB+TROPI)
Relative Index: 1.9 (ref 0.0–2.5)
Total CK: 150 U/L (ref 7–177)

## 2010-09-20 MED ORDER — IBUPROFEN 800 MG PO TABS
800.0000 mg | ORAL_TABLET | Freq: Once | ORAL | Status: AC
  Administered 2010-09-20: 800 mg via ORAL
  Filled 2010-09-20: qty 1

## 2010-09-20 MED ORDER — SODIUM CHLORIDE 0.9 % IV BOLUS (SEPSIS)
250.0000 mL | Freq: Once | INTRAVENOUS | Status: AC
  Administered 2010-09-20: 250 mL via INTRAVENOUS

## 2010-09-20 NOTE — ED Notes (Signed)
PT had syncopal episode at home. Ems reports that pt was alert and oriented when they arrived but had episode where she stared off to L side for approximately 2 minutes and was incontinent during transport. Pt has no complaints upon arriving to room

## 2010-09-20 NOTE — ED Notes (Signed)
Alert, talking, MM's very dry.

## 2010-09-20 NOTE — ED Provider Notes (Signed)
History     CSN: [id number] Arrival date & time: 09/20/2010  9:48 PM  Chief Complaint  Patient presents with  . Loss of Consciousness   HPI Comments: Pt reports h/o orthostatic hypotension, reports standing up and then having brief syncopal episode but denies prolonged downtime.  She reports h/o this in the past that resulted in a hip fracture She does report recent diarrhea, but no vomiting.  No rectal bleeding.   ?2 minute episode of AMS and was incontinent during the episode per EMS.  Patient is a 77 y.o. female presenting with syncope. The history is provided by the patient.  Loss of Consciousness This is a recurrent problem. Episode onset: earlier tonight. The problem occurs rarely. The problem has been gradually improving. Pertinent negatives include no chest pain, no abdominal pain, no headaches and no shortness of breath. Exacerbated by: standing. The symptoms are relieved by nothing.    Past Medical History  Diagnosis Date  . Thyroid disease   . Hypotension   . GERD (gastroesophageal reflux disease)     History reviewed. No pertinent past surgical history.  History reviewed. No pertinent family history.  History  Substance Use Topics  . Smoking status: Never Smoker   . Smokeless tobacco: Not on file  . Alcohol Use: No    OB History    Grav Para Term Preterm Abortions TAB SAB Ect Mult Living                  Review of Systems  Respiratory: Negative for shortness of breath.   Cardiovascular: Positive for syncope. Negative for chest pain.  Gastrointestinal: Negative for abdominal pain.  Neurological: Negative for headaches.  All other systems reviewed and are negative.    Physical Exam  BP 132/66  Pulse 94  Temp(Src) 101 F (38.3 C) (Oral)  Resp 18  SpO2 94%  Physical Exam  CONSTITUTIONAL: Well developed/well nourished HEAD AND FACE: Normocephalic/atraumatic EYES: EOMI/PERRL ENMT: Mucous membranes dry NECK: supple no meningeal signs SPINE:entire  spine nontender, nexus criteria met CV: S1/S2 noted, no murmurs/rubs/gallops noted LUNGS: Lungs are clear to auscultation bilaterally, no apparent distress ABDOMEN: soft, nontender, no rebound or guarding  NEURO: Pt is awake/alert, moves all extremitiesx4, no arm/leg drift is noted EXTREMITIES: pulses normal, full ROM, no tenderness SKIN: warm, color normal PSYCH: no abnormalities of mood noted   ED Course  Procedures  MDM Nursing notes reviewed and considered in documentation All labs/vitals reviewed and considered xrays reviewed and considered   Date: 09/20/2010  Rate: 84  Rhythm: normal sinus rhythm  QRS Axis: left  Intervals: normal  ST/T Wave abnormalities: nonspecific ST changes  Conduction Disutrbances:right bundle branch block  Narrative Interpretation:   Old EKG Reviewed: unchanged    Pt here with syncopal episode, ?sz activity per EMS.  Pt back to baseline.  Will follow closely 10:52 PM 11:55 PM d/w dr Orvan Falconer, will admit Pt found to be dehydrated, significant orthostatic hypotension Will admit, pt stabilized in the ED   Joya Gaskins, MD 09/21/10 [phone]

## 2010-09-21 ENCOUNTER — Encounter (HOSPITAL_COMMUNITY): Payer: Self-pay | Admitting: *Deleted

## 2010-09-21 DIAGNOSIS — E860 Dehydration: Secondary | ICD-10-CM | POA: Diagnosis present

## 2010-09-21 DIAGNOSIS — R509 Fever, unspecified: Secondary | ICD-10-CM | POA: Diagnosis present

## 2010-09-21 LAB — URINALYSIS, ROUTINE W REFLEX MICROSCOPIC
Bilirubin Urine: NEGATIVE
Glucose, UA: NEGATIVE mg/dL
Ketones, ur: NEGATIVE mg/dL
Leukocytes, UA: NEGATIVE
Nitrite: NEGATIVE
Protein, ur: NEGATIVE mg/dL
Specific Gravity, Urine: 1.02 (ref 1.005–1.030)
Urobilinogen, UA: 0.2 mg/dL (ref 0.0–1.0)
pH: 6 (ref 5.0–8.0)

## 2010-09-21 LAB — CARDIAC PANEL(CRET KIN+CKTOT+MB+TROPI)
CK, MB: 3.3 ng/mL (ref 0.3–4.0)
Relative Index: 3.2 — ABNORMAL HIGH (ref 0.0–2.5)
Total CK: 104 U/L (ref 7–177)
Total CK: 67 U/L (ref 7–177)

## 2010-09-21 LAB — URINE MICROSCOPIC-ADD ON

## 2010-09-21 LAB — BASIC METABOLIC PANEL
Chloride: 97 mEq/L (ref 96–112)
GFR calc Af Amer: 51 mL/min — ABNORMAL LOW (ref >60)
Potassium: 3.5 mEq/L (ref 3.5–5.1)

## 2010-09-21 LAB — CBC
HCT: 31.2 % — ABNORMAL LOW (ref 36.0–46.0)
Hemoglobin: 10.9 g/dL — ABNORMAL LOW (ref 12.0–15.0)
WBC: 9.4 10*3/uL (ref 4.0–10.5)

## 2010-09-21 LAB — HEPATIC FUNCTION PANEL
Indirect Bilirubin: 0.3 mg/dL (ref 0.3–0.9)
Total Protein: 5.4 g/dL — ABNORMAL LOW (ref 6.0–8.3)

## 2010-09-21 LAB — MAGNESIUM: Magnesium: 1.7 mg/dL (ref 1.5–2.5)

## 2010-09-21 MED ORDER — DEXTROSE 5 % IV SOLN
1.0000 g | Freq: Two times a day (BID) | INTRAVENOUS | Status: DC
  Administered 2010-09-21 – 2010-09-22 (×3): 1 g via INTRAVENOUS
  Filled 2010-09-21 (×6): qty 1

## 2010-09-21 MED ORDER — BISACODYL 10 MG RE SUPP: 10.0000 mg | RECTAL | Status: DC | PRN

## 2010-09-21 MED ORDER — SODIUM CHLORIDE 0.9 % IJ SOLN
INTRAMUSCULAR | Status: AC
  Administered 2010-09-21: 13:00:00
  Filled 2010-09-21: qty 3

## 2010-09-21 MED ORDER — SODIUM CHLORIDE 0.9 % IV SOLN: Freq: Once | INTRAVENOUS | Status: DC

## 2010-09-21 MED ORDER — SODIUM CHLORIDE 0.9 % IJ SOLN
INTRAMUSCULAR | Status: AC
  Administered 2010-09-21: 15:00:00
  Filled 2010-09-21: qty 10

## 2010-09-21 MED ORDER — SODIUM CHLORIDE 0.9 % IV SOLN
INTRAVENOUS | Status: DC
  Administered 2010-09-21 (×2): via INTRAVENOUS
  Filled 2010-09-21 (×7): qty 1000

## 2010-09-21 MED ORDER — POTASSIUM CHLORIDE 10 MEQ/100ML IV SOLN
10.0000 meq | INTRAVENOUS | Status: AC
  Administered 2010-09-21 (×2): 10 meq via INTRAVENOUS
  Filled 2010-09-21 (×2): qty 100

## 2010-09-21 MED ORDER — LEVOTHYROXINE SODIUM 88 MCG PO TABS
88.0000 ug | ORAL_TABLET | Freq: Every day | ORAL | Status: DC
  Administered 2010-09-21 – 2010-09-24 (×4): 88 ug via ORAL
  Filled 2010-09-21 (×5): qty 1

## 2010-09-21 MED ORDER — AZATHIOPRINE 50 MG PO TABS
50.0000 mg | ORAL_TABLET | Freq: Every day | ORAL | Status: DC
  Administered 2010-09-21 – 2010-09-24 (×4): 50 mg via ORAL
  Filled 2010-09-21 (×5): qty 1

## 2010-09-21 MED ORDER — FLEET ENEMA 7-19 GM/118ML RE ENEM: 1.0000 | ENEMA | RECTAL | Status: DC | PRN

## 2010-09-21 MED ORDER — ACETAMINOPHEN 325 MG PO TABS: 650.0000 mg | ORAL_TABLET | Freq: Four times a day (QID) | ORAL | Status: DC | PRN

## 2010-09-21 MED ORDER — SODIUM CHLORIDE 0.9 % IJ SOLN
INTRAMUSCULAR | Status: AC
  Administered 2010-09-21: 3 mL
  Filled 2010-09-21: qty 3

## 2010-09-21 MED ORDER — ASPIRIN EC 81 MG PO TBEC
81.0000 mg | DELAYED_RELEASE_TABLET | Freq: Every day | ORAL | Status: DC
  Filled 2010-09-21: qty 1

## 2010-09-21 MED ORDER — ASPIRIN EC 81 MG PO TBEC
81.0000 mg | DELAYED_RELEASE_TABLET | Freq: Every day | ORAL | Status: DC
  Administered 2010-09-21 – 2010-09-24 (×5): 81 mg via ORAL
  Filled 2010-09-21 (×4): qty 1

## 2010-09-21 MED ORDER — PANTOPRAZOLE SODIUM 40 MG PO TBEC
80.0000 mg | DELAYED_RELEASE_TABLET | Freq: Two times a day (BID) | ORAL | Status: DC
  Administered 2010-09-21 (×2): 80 mg via ORAL
  Filled 2010-09-21 (×2): qty 2

## 2010-09-21 MED ORDER — TRAMADOL HCL 50 MG PO TABS
50.0000 mg | ORAL_TABLET | Freq: Four times a day (QID) | ORAL | Status: DC | PRN
  Administered 2010-09-22: 50 mg via ORAL
  Filled 2010-09-21: qty 1

## 2010-09-21 MED ORDER — POTASSIUM CHLORIDE 10 MEQ/100ML IV SOLN
10.0000 meq | Freq: Once | INTRAVENOUS | Status: AC
  Administered 2010-09-21: 10 meq via INTRAVENOUS
  Filled 2010-09-21: qty 100

## 2010-09-21 MED ORDER — PREDNISONE 10 MG PO TABS
15.0000 mg | ORAL_TABLET | Freq: Every day | ORAL | Status: DC
  Administered 2010-09-21: 15 mg via ORAL
  Filled 2010-09-21: qty 2

## 2010-09-21 MED ORDER — ACETAMINOPHEN 650 MG RE SUPP: 650.0000 mg | Freq: Four times a day (QID) | RECTAL | Status: DC | PRN

## 2010-09-21 MED ORDER — ONDANSETRON HCL 4 MG/2ML IJ SOLN: 4.0000 mg | Freq: Four times a day (QID) | INTRAMUSCULAR | Status: DC | PRN

## 2010-09-21 MED ORDER — POLYETHYLENE GLYCOL 3350 17 G PO PACK: 17.0000 g | PACK | Freq: Every day | ORAL | Status: DC | PRN

## 2010-09-21 MED ORDER — ONDANSETRON HCL 4 MG PO TABS: 4.0000 mg | ORAL_TABLET | Freq: Four times a day (QID) | ORAL | Status: DC | PRN

## 2010-09-21 MED ORDER — ENOXAPARIN SODIUM 40 MG/0.4ML ~~LOC~~ SOLN
40.0000 mg | Freq: Every day | SUBCUTANEOUS | Status: DC
  Administered 2010-09-21 – 2010-09-24 (×4): 40 mg via SUBCUTANEOUS
  Filled 2010-09-21 (×4): qty 0.4

## 2010-09-21 MED ORDER — METHYLPREDNISOLONE SODIUM SUCC 40 MG IJ SOLR
40.0000 mg | INTRAMUSCULAR | Status: AC
  Administered 2010-09-21: 40 mg via INTRAVENOUS
  Filled 2010-09-21: qty 1

## 2010-09-21 NOTE — Progress Notes (Signed)
ANTIBIOTIC CONSULT NOTE - INITIAL  Pharmacy Consult for Cefepime Indication:  Empiric  Patient Measurements: Height: 5\' 7"  (170.2 cm) Weight: 163 lb 2.3 oz (74 kg) IBW/kg (Calculated) : 61.6  Adjusted Body Weight: N/A  Vital Signs: Temp: 97.4 F (36.3 C) (08/25 0642) Temp src: Oral (08/25 0642) BP: 115/72 mmHg (08/25 0642) Pulse Rate: 62  (08/25 0642)      Labs:  Alvira Philips 09/21/10 0526 09/20/10 2227  WBC 9.4 8.4  HGB 10.9* 12.3  PLT 54* 52*  LABCREA -- --  CREATININE 1.21* 1.18*  CRCLEARANCE -- --      Microbiology: Recent Results (from the past 720 hour(s))  CULTURE, BLOOD (ROUTINE X 2)     Status: Normal (Preliminary result)   Collection Time   09/21/10 12:15 AM      Component Value Range Status Comment   Specimen Description BLOOD LEFT HAND   Final    Special Requests BOTTLES DRAWN AEROBIC AND ANAEROBIC 10CC   Final    Culture NO GROWTH <24 HRS   Final    Report Status PENDING   Incomplete   CULTURE, BLOOD (ROUTINE X 2)     Status: Normal (Preliminary result)   Collection Time   09/21/10 12:20 AM      Component Value Range Status Comment   Specimen Description BLOOD RIGHT ANTECUBITAL   Final    Special Requests BOTTLES DRAWN AEROBIC AND ANAEROBIC 10CC   Final    Culture NO GROWTH <24 HRS   Final    Report Status PENDING   Incomplete     Medical History: Past Medical History  Diagnosis Date  . Thyroid disease   . Hypotension   . GERD (gastroesophageal reflux disease)     Medications:    Reviewed  Assessment:  Empiric therapy. Estimated Clcr 36.4 mls per minute. Will need dosage adjustment.   Plan:   Cefepime 1 gram IV every 24 hours (adjusted dose due to renal function parameters). Monitor renal function parameters and adjust dose as indicated. Follow up culture and sensitivities.  Gilman Buttner, [place] J 09/21/2010,8:47 AM

## 2010-09-21 NOTE — ED Notes (Signed)
Alert, NAD, daughter at bedside.

## 2010-09-21 NOTE — ED Notes (Signed)
Dr Campbell into see pt.

## 2010-09-21 NOTE — Progress Notes (Signed)
Subjective: This very pleasant 77 year old lady was admitted yesterday with fever and orthostatic hypotension which is long-standing problem for her. She was recently treated as an outpatient with Keflex for UTI. She has been started on intravenous Maxipime here in the hospital. She feels reasonably well today and she notes that on "normal" day her blood pressure fluctuates significantly. She was on a tapering dose of prednisone and was down to 5 mg daily prior to discontinuing it. This was for her autoimmune hepatitis and the plan was that Imuran would be the only medication that would be used once prednisone was discontinued. Since admission, her prednisone has appropriately been increased to 15 mg daily for the time being. She feels back to her usual self this morning.           Physical Exam: Blood pressure 81/47, pulse 71, temperature 97.5 F (36.4 C), temperature source Oral, resp. rate 18, height 5\' 7"  (1.702 m), weight 74 kg (163 lb 2.3 oz), SpO2 95.00%. She looks systemically well and is not clinically shock despite her soft blood pressure. Heart sounds are present and normal. There are no murmurs. Lung fields are clear. Her abdomen is soft and nontender without any hepatosplenomegaly. There are no masses. Neurologically she is intact, alert and orientated without any focal neurological signs.   Investigations: Results for orders placed during the hospital encounter of 09/20/10 (from the past 48 hour(s))  CBC     Status: Abnormal   Collection Time   09/20/10 10:27 PM      Component Value Range Comment   WBC 8.4  4.0 - 10.5 (K/uL)    RBC 3.97  3.87 - 5.11 (MIL/uL)    Hemoglobin 12.3  12.0 - 15.0 (g/dL)    HCT 16.1 (*) 09.6 - 46.0 (%)    MCV 88.7  78.0 - 100.0 (fL)    MCH 31.0  26.0 - 34.0 (pg)    MCHC 34.9  30.0 - 36.0 (g/dL)    RDW 04.5  40.9 - 81.1 (%)    Platelets 52 (*) 150 - 400 (K/uL)   DIFFERENTIAL     Status: Abnormal   Collection Time   09/20/10 10:27 PM   Component Value Range Comment   Neutrophils Relative 94 (*) 43 - 77 (%)    Neutro Abs 7.9 (*) 1.7 - 7.7 (K/uL)    Lymphocytes Relative 2 (*) 12 - 46 (%)    Lymphs Abs 0.2 (*) 0.7 - 4.0 (K/uL)    Monocytes Relative 3  3 - 12 (%)    Monocytes Absolute 0.3  0.1 - 1.0 (K/uL)    Eosinophils Relative 1  0 - 5 (%)    Eosinophils Absolute 0.1  0.0 - 0.7 (K/uL)    Basophils Relative 0  0 - 1 (%)    Basophils Absolute 0.0  0.0 - 0.1 (K/uL)   BASIC METABOLIC PANEL     Status: Abnormal   Collection Time   09/20/10 10:27 PM      Component Value Range Comment   Sodium 127 (*) 135 - 145 (mEq/L)    Potassium 3.0 (*) 3.5 - 5.1 (mEq/L)    Chloride 93 (*) 96 - 112 (mEq/L)    CO2 23  19 - 32 (mEq/L)    Glucose, Bld 109 (*) 70 - 99 (mg/dL)    BUN 25 (*) 6 - 23 (mg/dL)    Creatinine, Ser 9.14 (*) 0.50 - 1.10 (mg/dL)    Calcium 8.6  8.4 - 10.5 (mg/dL)  GFR calc non Af Amer 44 (*) >60 (mL/min)    GFR calc Af Amer 53 (*) >60 (mL/min)   PROTIME-INR     Status: Abnormal   Collection Time   09/20/10 10:27 PM      Component Value Range Comment   Prothrombin Time 16.0 (*) 11.6 - 15.2 (seconds)    INR 1.25  0.00 - 1.49    CARDIAC PANEL(CRET KIN+CKTOT+MB+TROPI)     Status: Normal   Collection Time   09/20/10 10:39 PM      Component Value Range Comment   Total CK 150  7 - 177 (U/L)    CK, MB 2.8  0.3 - 4.0 (ng/mL)    Troponin I <0.30  <0.30 (ng/mL)    Relative Index 1.9  0.0 - 2.5    URINALYSIS, ROUTINE W REFLEX MICROSCOPIC     Status: Abnormal   Collection Time   09/20/10 11:25 PM      Component Value Range Comment   Color, Urine YELLOW  YELLOW     Appearance CLEAR  CLEAR     Specific Gravity, Urine 1.020  1.005 - 1.030     pH 6.0  5.0 - 8.0     Glucose, UA NEGATIVE  NEGATIVE (mg/dL)    Hgb urine dipstick TRACE (*) NEGATIVE     Bilirubin Urine NEGATIVE  NEGATIVE     Ketones, ur NEGATIVE  NEGATIVE (mg/dL)    Protein, ur NEGATIVE  NEGATIVE (mg/dL)    Urobilinogen, UA 0.2  0.0 - 1.0 (mg/dL)     Nitrite NEGATIVE  NEGATIVE     Leukocytes, UA NEGATIVE  NEGATIVE    URINE MICROSCOPIC-ADD ON     Status: Abnormal   Collection Time   09/20/10 11:25 PM      Component Value Range Comment   Squamous Epithelial / LPF RARE  RARE     WBC, UA 0-2  <3 (WBC/hpf)    RBC / HPF 0-2  <3 (RBC/hpf)    Bacteria, UA FEW (*) RARE    CULTURE, BLOOD (ROUTINE X 2)     Status: Normal (Preliminary result)   Collection Time   09/21/10 12:15 AM      Component Value Range Comment   Specimen Description BLOOD LEFT HAND      Special Requests BOTTLES DRAWN AEROBIC AND ANAEROBIC 10CC      Culture NO GROWTH <24 HRS      Report Status PENDING     CULTURE, BLOOD (ROUTINE X 2)     Status: Normal (Preliminary result)   Collection Time   09/21/10 12:20 AM      Component Value Range Comment   Specimen Description BLOOD RIGHT ANTECUBITAL      Special Requests BOTTLES DRAWN AEROBIC AND ANAEROBIC 10CC      Culture NO GROWTH <24 HRS      Report Status PENDING     HEPATIC FUNCTION PANEL     Status: Abnormal   Collection Time   09/21/10 12:20 AM      Component Value Range Comment   Total Protein 5.4 (*) 6.0 - 8.3 (g/dL)    Albumin 2.3 (*) 3.5 - 5.2 (g/dL)    AST 59 (*) 0 - 37 (U/L)    ALT 28  0 - 35 (U/L)    Alkaline Phosphatase 85  39 - 117 (U/L)    Total Bilirubin 0.7  0.3 - 1.2 (mg/dL)    Bilirubin, Direct 0.4 (*) 0.0 - 0.3 (mg/dL)    Indirect  Bilirubin 0.3  0.3 - 0.9 (mg/dL)   MAGNESIUM     Status: Normal   Collection Time   09/21/10 12:20 AM      Component Value Range Comment   Magnesium 1.6  1.5 - 2.5 (mg/dL)   MAGNESIUM     Status: Normal   Collection Time   09/21/10  5:26 AM      Component Value Range Comment   Magnesium 1.7  1.5 - 2.5 (mg/dL)   BASIC METABOLIC PANEL     Status: Abnormal   Collection Time   09/21/10  5:26 AM      Component Value Range Comment   Sodium 127 (*) 135 - 145 (mEq/L)    Potassium 3.5  3.5 - 5.1 (mEq/L)    Chloride 97  96 - 112 (mEq/L)    CO2 22  19 - 32 (mEq/L)    Glucose,  Bld 129 (*) 70 - 99 (mg/dL)    BUN 26 (*) 6 - 23 (mg/dL)    Creatinine, Ser 9.60 (*) 0.50 - 1.10 (mg/dL)    Calcium 8.1 (*) 8.4 - 10.5 (mg/dL)    GFR calc non Af Amer 42 (*) >60 (mL/min)    GFR calc Af Amer 51 (*) >60 (mL/min)   CBC     Status: Abnormal   Collection Time   09/21/10  5:26 AM      Component Value Range Comment   WBC 9.4  4.0 - 10.5 (K/uL)    RBC 3.53 (*) 3.87 - 5.11 (MIL/uL)    Hemoglobin 10.9 (*) 12.0 - 15.0 (g/dL)    HCT 45.4 (*) 09.8 - 46.0 (%)    MCV 88.4  78.0 - 100.0 (fL)    MCH 30.9  26.0 - 34.0 (pg)    MCHC 34.9  30.0 - 36.0 (g/dL)    RDW 11.9  14.7 - 82.9 (%)    Platelets 54 (*) 150 - 400 (K/uL) PLATELET COUNT CONFIRMED BY SMEAR  CARDIAC PANEL(CRET KIN+CKTOT+MB+TROPI)     Status: Abnormal   Collection Time   09/21/10  7:39 AM      Component Value Range Comment   Total CK 104  7 - 177 (U/L)    CK, MB 3.3  0.3 - 4.0 (ng/mL)    Troponin I <0.30  <0.30 (ng/mL)    Relative Index 3.2 (*) 0.0 - 2.5     Recent Results (from the past 240 hour(s))  CULTURE, BLOOD (ROUTINE X 2)     Status: Normal (Preliminary result)   Collection Time   09/21/10 12:15 AM      Component Value Range Status Comment   Specimen Description BLOOD LEFT HAND   Final    Special Requests BOTTLES DRAWN AEROBIC AND ANAEROBIC 10CC   Final    Culture NO GROWTH <24 HRS   Final    Report Status PENDING   Incomplete   CULTURE, BLOOD (ROUTINE X 2)     Status: Normal (Preliminary result)   Collection Time   09/21/10 12:20 AM      Component Value Range Status Comment   Specimen Description BLOOD RIGHT ANTECUBITAL   Final    Special Requests BOTTLES DRAWN AEROBIC AND ANAEROBIC 10CC   Final    Culture NO GROWTH <24 HRS   Final    Report Status PENDING   Incomplete     Dg Chest 1 View  09/20/2010  *RADIOLOGY REPORT*  Clinical Data: Fever, syncope.  CHEST - 1 VIEW  Comparison: 06/26/2010  Findings:  Large hiatal hernia.  Heart is borderline in size.  Low lung volumes.  Minimal bibasilar atelectasis.   No effusions or acute bony abnormality.  IMPRESSION: Low lung volumes, bibasilar atelectasis.  Hiatal hernia.  Original Report Authenticated By: Cyndie Chime, M.D.   Ct Head Wo Contrast  09/20/2010  *RADIOLOGY REPORT*  Clinical Data: Loss of consciousness  CT HEAD WITHOUT CONTRAST  Technique:  Contiguous axial images were obtained from the base of the skull through the vertex without contrast.  Comparison: 01/30/2009  Findings: Mild prominence of the sulci, cisterns, and ventricles, in keeping with volume loss. There are mild periventricular white matter hypodensities, a nonspecific finding most often seen with chronic microangiopathic changes.  There is no evidence for acute hemorrhage, overt hydrocephalus, mass lesion, or abnormal extra-axial fluid collection.  No definite CT evidence for acute cortical based (large artery) infarction. The visualized paranasal sinuses and mastoid air cells are predominately clear.  IMPRESSION: No definite acute intracranial abnormality.  Original Report Authenticated By: Waneta Martins, M.D.      Medications: I have reviewed the patient's current medications.  Impression: 1. Fever, likely secondary to UTI. 2. Orthostatic hypotension, chronic problem. 3. Autoimmune hepatitis.     Plan: 1. Continue current treatment. Observe for fevers. 2. Hopefully discharge home soon if medically stable.     LOS: 1 day   Quinntin Malter C 09/21/2010, 1:19 PM

## 2010-09-21 NOTE — H&P (Signed)
PCP:   Dwana Melena, MD   Gastroenterologist:  Dr. Judy Pimple.  [facility]   Chief Complaint:  Recurrent syncope HPI: 77 y/o CF with h/o recurrent orthostasis, intolerant of fludrocortisone, currently on lasix for leg edima, and has almost completed a prednisone taper of Autoimmune hepatitis. Because of long-standing orthostasis she recognizes the symptoms and epot she had such an episode on the day of admission, associated with loss of consciousness. Did not feel she could get up so pushed her Lif[facility] button and was brought to the ED.  Has been having urge incontinence and dysuria for the past 1 week, not really helped by Keflex.  No Nauseo, vomiting or diarrhoea; no black or bloody stool.  Review of Systems:  The patient denies anorexia, fever, weight loss,, vision loss, decreased hearing, hoarseness, chest pain, syncope, dyspnea on exertion, peripheral edema, balance deficits, hemoptysis, abdominal pain, melena, hematochezia, severe indigestion/heartburn, hematuria, incontinence, genital sores, muscle weakness, suspicious skin lesions, transient blindness, difficulty walking, depression, unusual weight change, abnormal bleeding, enlarged lymph nodes, angioedema, and breast masses.  Past Medical History: Past Medical History  Diagnosis Date  . Thyroid disease   . Hypotension   . GERD (gastroesophageal reflux disease)    Past Surgical History  Procedure Date  . Abdominal hysterectomy   . Hip surgery 10/23/09    left hip partial replacement, kept socket and had ball and shaft replaced    Medications: Prior to Admission medications   Medication Sig Start Date End Date Taking? Authorizing Provider  aspirin EC 81 MG tablet Take 81 mg by mouth daily.     Yes Historical Provider, MD  azaTHIOprine (IMURAN) 50 MG tablet Take 50 mg by mouth daily.     Yes Historical Provider, MD  calcium-vitamin D (OSCAL WITH D) 500-200 MG-UNIT per tablet Take 1 tablet by mouth daily.     Yes  Historical Provider, MD  cephALEXin (KEFLEX) 500 MG capsule Take 500 mg by mouth 4 (four) times daily.     Yes Historical Provider, MD  furosemide (LASIX) 40 MG tablet Take 40 mg by mouth daily.     Yes Historical Provider, MD  levothyroxine (SYNTHROID, LEVOTHROID) 88 MCG tablet Take 88 mcg by mouth daily. For thyroid    Yes Historical Provider, MD  omeprazole (PRILOSEC) 40 MG capsule Take 40 mg by mouth 2 (two) times daily. For acid refux    Yes Historical Provider, MD  polyethylene glycol powder (GLYCOLAX/MIRALAX) powder Take 17 g by mouth daily.     Yes Historical Provider, MD  predniSONE (DELTASONE) 10 MG tablet Take 10 mg by mouth as directed. Take 10mg  po once daily for 14 days, then take 5mg  once daily for 14 days    Yes Historical Provider, MD  traMADol (ULTRAM) 50 MG tablet Take 50 mg by mouth every 6 (six) hours as needed. For pain    Yes Historical Provider, MD  VITAMIN B1-B12 IJ Inject 1 each as directed every 30 (thirty) days. Last injection 08/22/2010    Yes Historical Provider, MD    Allergies:  No Known Allergies  Social History:  reports that she has quit smoking. She does not have any smokeless tobacco history on file. She reports that she does not drink alcohol or use illicit drugs.  Family History: History reviewed. No pertinent family history.  Physical Exam: Filed Vitals:   09/20/10 2339 09/21/10 0142 09/21/10 0150 09/21/10 0211  BP: 69/32 93/47  95/60  Pulse: 98 73  70  Temp:   99.1  F (37.3 C) 98.5 F (36.9 C)  TempSrc:   Oral Oral  Resp:    18  Height:    5\' 7"  (1.702 m)  Weight:    74 kg (163 lb 2.3 oz)  SpO2:  93%  96%   General appearance: alert, no distress and dehydrated Head: Normocephalic, without obvious abnormality, atraumatic Eyes: conjunctivae/corneas clear. PERRL, markedly dehydrated. Throat: lips, mucosa, and tongue dry. Neck: no adenopathy, no carotid bruit, no JVD, supple, symmetrical, trachea midline and thyroid not enlarged, symmetric, no  tenderness/mass/nodules Back: symmetric, no curvature. ROM normal. No CVA tenderness. Resp: clear to auscultation bilaterally Chest wall: no tenderness Cardio: regular rate and rhythm, S1, S2 normal, no murmur, click, rub or gallop GI: soft, non-tender; bowel sounds normal; no masses,  no organomegaly Extremities: edema trace bilat Pulses: 2+ and symmetric Skin: Skin color, texture, turgor normal. No rashes or lesions Neurologic: Grossly normal   Labs on Admission:   [facility] 09/21/10 0020 09/20/10 2227  NA -- 127*  K -- 3.0*  CL -- 93*  CO2 -- 23  GLUCOSE -- 109*  BUN -- 25*  CREATININE -- 1.18*  CALCIUM -- 8.6  MG 1.6 --  PHOS -- --    Basename 09/21/10 0020  AST 59*  ALT 28  ALKPHOS 85  BILITOT 0.7  PROT 5.4*  ALBUMIN 2.3*   No results found for this basename: LIPASE:2,AMYLASE:2 in the last 72 hours  Basename 09/20/10 2227  WBC 8.4  NEUTROABS 7.9*  HGB 12.3  HCT 35.2*  MCV 88.7  PLT 52*    Basename 09/20/10 2239  CKTOTAL 150  CKMB 2.8  CKMBINDEX --  TROPONINI <0.30    Radiological Exams on Admission: Dg Chest 1 View  09/20/2010  *RADIOLOGY REPORT*  Clinical Data: Fever, syncope.  CHEST - 1 VIEW  Comparison: 06/26/2010  Findings: Large hiatal hernia.  Heart is borderline in size.  Low lung volumes.  Minimal bibasilar atelectasis.  No effusions or acute bony abnormality.  IMPRESSION: Low lung volumes, bibasilar atelectasis.  Hiatal hernia.  Original Report Authenticated By: Cyndie Chime, M.D.   Ct Head Wo Contrast  09/20/2010  *RADIOLOGY REPORT*  Clinical Data: Loss of consciousness  CT HEAD WITHOUT CONTRAST  Technique:  Contiguous axial images were obtained from the base of the skull through the vertex without contrast.  Comparison: 01/30/2009  Findings: Mild prominence of the sulci, cisterns, and ventricles, in keeping with volume loss. There are mild periventricular white matter hypodensities, a nonspecific finding most often seen with chronic  microangiopathic changes.  There is no evidence for acute hemorrhage, overt hydrocephalus, mass lesion, or abnormal extra-axial fluid collection.  No definite CT evidence for acute cortical based (large artery) infarction. The visualized paranasal sinuses and mastoid air cells are predominately clear.  IMPRESSION: No definite acute intracranial abnormality.  Original Report Authenticated By: Waneta Martins, M.D.    Assessment/Plan Present on Admission:  .HYPOTHYROIDISM .Orthostatic hypotension .HYPERLIPIDEMIA .GERD .CYSTITIS, RECURRENT .Dehydration .Fever Autoimmune hepatitis  Syncope due to orthostasis. Orthostasis from chronic disposition, aggravatged by dehydration from lasix use and fever; Incompletely tereated UTI  Will bring in for hydration and syncope work-up including ECHO.  Kais Monje 09/21/2010, 5:34 AM

## 2010-09-22 ENCOUNTER — Inpatient Hospital Stay (HOSPITAL_COMMUNITY): Payer: Medicare Other

## 2010-09-22 LAB — COMPREHENSIVE METABOLIC PANEL
ALT: 27 U/L (ref 0–35)
AST: 49 U/L — ABNORMAL HIGH (ref 0–37)
Albumin: 2.3 g/dL — ABNORMAL LOW (ref 3.5–5.2)
CO2: 22 mEq/L (ref 19–32)
Calcium: 8.5 mg/dL (ref 8.4–10.5)
GFR calc non Af Amer: 49 mL/min — ABNORMAL LOW (ref >60)
Sodium: 129 mEq/L — ABNORMAL LOW (ref 135–145)

## 2010-09-22 LAB — CARDIAC PANEL(CRET KIN+CKTOT+MB+TROPI)
Relative Index: INVALID (ref 0.0–2.5)
Total CK: 36 U/L (ref 7–177)

## 2010-09-22 LAB — CBC
HCT: 33.2 % — ABNORMAL LOW (ref 36.0–46.0)
Hemoglobin: 11.9 g/dL — ABNORMAL LOW (ref 12.0–15.0)
MCH: 31.4 pg (ref 26.0–34.0)
MCHC: 35.8 g/dL (ref 30.0–36.0)
MCV: 87.6 fL (ref 78.0–100.0)

## 2010-09-22 MED ORDER — PREDNISONE 10 MG PO TABS
5.0000 mg | ORAL_TABLET | Freq: Every day | ORAL | Status: DC
  Administered 2010-09-23 – 2010-09-24 (×2): 5 mg via ORAL
  Filled 2010-09-22 (×2): qty 1

## 2010-09-22 MED ORDER — IOHEXOL 300 MG/ML  SOLN
75.0000 mL | Freq: Once | INTRAMUSCULAR | Status: AC | PRN
  Administered 2010-09-22: 75 mL via INTRAVENOUS

## 2010-09-22 MED ORDER — POTASSIUM CHLORIDE IN NACL 20-0.9 MEQ/L-% IV SOLN
INTRAVENOUS | Status: DC
  Administered 2010-09-22 – 2010-09-23 (×2): via INTRAVENOUS

## 2010-09-22 MED ORDER — CEFEPIME HCL 1 G IJ SOLR
1.0000 g | INTRAMUSCULAR | Status: DC
  Administered 2010-09-22 – 2010-09-23 (×2): 1 g via INTRAVENOUS
  Filled 2010-09-22 (×3): qty 1

## 2010-09-22 MED ORDER — NON FORMULARY: 20.0000 mg | Freq: Two times a day (BID) | Status: DC

## 2010-09-22 MED ORDER — PREDNISONE 10 MG PO TABS
10.0000 mg | ORAL_TABLET | Freq: Every day | ORAL | Status: DC
Start: 2010-09-22 — End: 2010-09-22
  Administered 2010-09-22: 10 mg via ORAL
  Filled 2010-09-22: qty 1

## 2010-09-22 MED ORDER — OMEPRAZOLE 20 MG PO CPDR
20.0000 mg | DELAYED_RELEASE_CAPSULE | Freq: Two times a day (BID) | ORAL | Status: DC
  Administered 2010-09-22 – 2010-09-24 (×5): 20 mg via ORAL
  Filled 2010-09-22 (×7): qty 1

## 2010-09-22 NOTE — Progress Notes (Signed)
Subjective: This very pleasant 77 year old lady was admitted yesterday with fever and orthostatic hypotension which is long-standing problem for her. She was recently treated as an outpatient with Keflex for UTI. She has been started on intravenous Maxipime here in the hospital. She feels reasonably well today and she notes that on "normal" day her blood pressure fluctuates significantly. She was on a tapering dose of prednisone and was down to 5 mg daily prior to discontinuing it. This was for her autoimmune hepatitis and the plan was that Imuran would be the only medication that would be used once prednisone was discontinued. She feels well today except for headache. She has been started on Protonix was in the hospital instead of her omeprazole which she is usually on and tolerated. I wonder if this is giving her the headaches. Otherwise today she feels well and is keen to go home. She has had no fevers. Blood cultures are negative so far.           Physical Exam: Blood pressure 116/72, pulse 74, temperature 98 F (36.7 Curtis), temperature source Oral, resp. rate 17, height 5\' 7"  (1.702 m), weight 76.658 kg (169 lb), SpO2 95.00%. She looks systemically well and is not clinically shock despite her soft blood pressure. Heart sounds are present and normal. There are no murmurs. Lung fields are clear. Her abdomen is soft and nontender without any hepatosplenomegaly. There are no masses. Neurologically she is intact, alert and orientated without any focal neurological signs.   Investigations: Results for orders placed during the hospital encounter of 09/20/10 (from the past 48 hour(s))  CBC     Status: Abnormal   Collection Time   09/20/10 10:27 PM      Component Value Range Comment   WBC 8.4  4.0 - 10.5 (K/uL)    RBC 3.97  3.87 - 5.11 (MIL/uL)    Hemoglobin 12.3  12.0 - 15.0 (g/dL)    HCT 16.1 (*) 09.6 - 46.0 (%)    MCV 88.7  78.0 - 100.0 (fL)    MCH 31.0  26.0 - 34.0 (pg)    MCHC 34.9  30.0 -  36.0 (g/dL)    RDW 04.5  40.9 - 81.1 (%)    Platelets 52 (*) 150 - 400 (K/uL)   DIFFERENTIAL     Status: Abnormal   Collection Time   09/20/10 10:27 PM      Component Value Range Comment   Neutrophils Relative 94 (*) 43 - 77 (%)    Neutro Abs 7.9 (*) 1.7 - 7.7 (K/uL)    Lymphocytes Relative 2 (*) 12 - 46 (%)    Lymphs Abs 0.2 (*) 0.7 - 4.0 (K/uL)    Monocytes Relative 3  3 - 12 (%)    Monocytes Absolute 0.3  0.1 - 1.0 (K/uL)    Eosinophils Relative 1  0 - 5 (%)    Eosinophils Absolute 0.1  0.0 - 0.7 (K/uL)    Basophils Relative 0  0 - 1 (%)    Basophils Absolute 0.0  0.0 - 0.1 (K/uL)   BASIC METABOLIC PANEL     Status: Abnormal   Collection Time   09/20/10 10:27 PM      Component Value Range Comment   Sodium 127 (*) 135 - 145 (mEq/L)    Potassium 3.0 (*) 3.5 - 5.1 (mEq/L)    Chloride 93 (*) 96 - 112 (mEq/L)    CO2 23  19 - 32 (mEq/L)    Glucose, Bld 109 (*)  70 - 99 (mg/dL)    BUN 25 (*) 6 - 23 (mg/dL)    Creatinine, Ser 1.61 (*) 0.50 - 1.10 (mg/dL)    Calcium 8.6  8.4 - 10.5 (mg/dL)    GFR calc non Af Amer 44 (*) >60 (mL/min)    GFR calc Af Amer 53 (*) >60 (mL/min)   PROTIME-INR     Status: Abnormal   Collection Time   09/20/10 10:27 PM      Component Value Range Comment   Prothrombin Time 16.0 (*) 11.6 - 15.2 (seconds)    INR 1.25  0.00 - 1.49    CARDIAC PANEL(CRET KIN+CKTOT+MB+TROPI)     Status: Normal   Collection Time   09/20/10 10:39 PM      Component Value Range Comment   Total CK 150  7 - 177 (U/L)    CK, MB 2.8  0.3 - 4.0 (ng/mL)    Troponin I <0.30  <0.30 (ng/mL)    Relative Index 1.9  0.0 - 2.5    URINALYSIS, ROUTINE W REFLEX MICROSCOPIC     Status: Abnormal   Collection Time   09/20/10 11:25 PM      Component Value Range Comment   Color, Urine YELLOW  YELLOW     Appearance CLEAR  CLEAR     Specific Gravity, Urine 1.020  1.005 - 1.030     pH 6.0  5.0 - 8.0     Glucose, UA NEGATIVE  NEGATIVE (mg/dL)    Hgb urine dipstick TRACE (*) NEGATIVE     Bilirubin  Urine NEGATIVE  NEGATIVE     Ketones, ur NEGATIVE  NEGATIVE (mg/dL)    Protein, ur NEGATIVE  NEGATIVE (mg/dL)    Urobilinogen, UA 0.2  0.0 - 1.0 (mg/dL)    Nitrite NEGATIVE  NEGATIVE     Leukocytes, UA NEGATIVE  NEGATIVE    URINE MICROSCOPIC-ADD ON     Status: Abnormal   Collection Time   09/20/10 11:25 PM      Component Value Range Comment   Squamous Epithelial / LPF RARE  RARE     WBC, UA 0-2  <3 (WBC/hpf)    RBC / HPF 0-2  <3 (RBC/hpf)    Bacteria, UA FEW (*) RARE    CULTURE, BLOOD (ROUTINE X 2)     Status: Normal (Preliminary result)   Collection Time   09/21/10 12:15 AM      Component Value Range Comment   Specimen Description BLOOD LEFT HAND      Special Requests BOTTLES DRAWN AEROBIC AND ANAEROBIC 10CC      Culture NO GROWTH 1 DAY      Report Status PENDING     CULTURE, BLOOD (ROUTINE X 2)     Status: Normal (Preliminary result)   Collection Time   09/21/10 12:20 AM      Component Value Range Comment   Specimen Description BLOOD RIGHT ANTECUBITAL      Special Requests BOTTLES DRAWN AEROBIC AND ANAEROBIC 10CC      Culture NO GROWTH 1 DAY      Report Status PENDING     HEPATIC FUNCTION PANEL     Status: Abnormal   Collection Time   09/21/10 12:20 AM      Component Value Range Comment   Total Protein 5.4 (*) 6.0 - 8.3 (g/dL)    Albumin 2.3 (*) 3.5 - 5.2 (g/dL)    AST 59 (*) 0 - 37 (U/L)    ALT 28  0 - 35 (  U/L)    Alkaline Phosphatase 85  39 - 117 (U/L)    Total Bilirubin 0.7  0.3 - 1.2 (mg/dL)    Bilirubin, Direct 0.4 (*) 0.0 - 0.3 (mg/dL)    Indirect Bilirubin 0.3  0.3 - 0.9 (mg/dL)   MAGNESIUM     Status: Normal   Collection Time   09/21/10 12:20 AM      Component Value Range Comment   Magnesium 1.6  1.5 - 2.5 (mg/dL)   MAGNESIUM     Status: Normal   Collection Time   09/21/10  5:26 AM      Component Value Range Comment   Magnesium 1.7  1.5 - 2.5 (mg/dL)   BASIC METABOLIC PANEL     Status: Abnormal   Collection Time   09/21/10  5:26 AM      Component Value Range  Comment   Sodium 127 (*) 135 - 145 (mEq/L)    Potassium 3.5  3.5 - 5.1 (mEq/L)    Chloride 97  96 - 112 (mEq/L)    CO2 22  19 - 32 (mEq/L)    Glucose, Bld 129 (*) 70 - 99 (mg/dL)    BUN 26 (*) 6 - 23 (mg/dL)    Creatinine, Ser 8.46 (*) 0.50 - 1.10 (mg/dL)    Calcium 8.1 (*) 8.4 - 10.5 (mg/dL)    GFR calc non Af Amer 42 (*) >60 (mL/min)    GFR calc Af Amer 51 (*) >60 (mL/min)   CBC     Status: Abnormal   Collection Time   09/21/10  5:26 AM      Component Value Range Comment   WBC 9.4  4.0 - 10.5 (K/uL)    RBC 3.53 (*) 3.87 - 5.11 (MIL/uL)    Hemoglobin 10.9 (*) 12.0 - 15.0 (g/dL)    HCT 96.2 (*) 95.2 - 46.0 (%)    MCV 88.4  78.0 - 100.0 (fL)    MCH 30.9  26.0 - 34.0 (pg)    MCHC 34.9  30.0 - 36.0 (g/dL)    RDW 84.1  32.4 - 40.1 (%)    Platelets 54 (*) 150 - 400 (K/uL) PLATELET COUNT CONFIRMED BY SMEAR  CARDIAC PANEL(CRET KIN+CKTOT+MB+TROPI)     Status: Abnormal   Collection Time   09/21/10  7:39 AM      Component Value Range Comment   Total CK 104  7 - 177 (U/L)    CK, MB 3.3  0.3 - 4.0 (ng/mL)    Troponin I <0.30  <0.30 (ng/mL)    Relative Index 3.2 (*) 0.0 - 2.5    CARDIAC PANEL(CRET KIN+CKTOT+MB+TROPI)     Status: Normal   Collection Time   09/21/10  3:50 PM      Component Value Range Comment   Total CK 67  7 - 177 (U/L)    CK, MB 3.7  0.3 - 4.0 (ng/mL)    Troponin I <0.30  <0.30 (ng/mL)    Relative Index RELATIVE INDEX IS INVALID  0.0 - 2.5    CARDIAC PANEL(CRET KIN+CKTOT+MB+TROPI)     Status: Normal   Collection Time   09/22/10  5:23 AM      Component Value Range Comment   Total CK 36  7 - 177 (U/L)    CK, MB 3.6  0.3 - 4.0 (ng/mL)    Troponin I <0.30  <0.30 (ng/mL)    Relative Index RELATIVE INDEX IS INVALID  0.0 - 2.5    COMPREHENSIVE METABOLIC PANEL  Status: Abnormal   Collection Time   09/22/10  5:24 AM      Component Value Range Comment   Sodium 129 (*) 135 - 145 (mEq/L)    Potassium 4.0  3.5 - 5.1 (mEq/L)    Chloride 100  96 - 112 (mEq/L)    CO2 22  19 -  32 (mEq/L)    Glucose, Bld 107 (*) 70 - 99 (mg/dL)    BUN 29 (*) 6 - 23 (mg/dL)    Creatinine, Ser 4.54  0.50 - 1.10 (mg/dL)    Calcium 8.5  8.4 - 10.5 (mg/dL)    Total Protein 5.7 (*) 6.0 - 8.3 (g/dL)    Albumin 2.3 (*) 3.5 - 5.2 (g/dL)    AST 49 (*) 0 - 37 (U/L)    ALT 27  0 - 35 (U/L)    Alkaline Phosphatase 89  39 - 117 (U/L)    Total Bilirubin 0.5  0.3 - 1.2 (mg/dL)    GFR calc non Af Amer 49 (*) >60 (mL/min)    GFR calc Af Amer 60 (*) >60 (mL/min)   CBC     Status: Abnormal   Collection Time   09/22/10  5:24 AM      Component Value Range Comment   WBC 12.2 (*) 4.0 - 10.5 (K/uL)    RBC 3.79 (*) 3.87 - 5.11 (MIL/uL)    Hemoglobin 11.9 (*) 12.0 - 15.0 (g/dL)    HCT 09.8 (*) 11.9 - 46.0 (%)    MCV 87.6  78.0 - 100.0 (fL)    MCH 31.4  26.0 - 34.0 (pg)    MCHC 35.8  30.0 - 36.0 (g/dL)    RDW 14.7  82.9 - 56.2 (%)    Platelets 70 (*) 150 - 400 (K/uL) PLATELET COUNT CONFIRMED BY SMEAR   Recent Results (from the past 240 hour(s))  CULTURE, BLOOD (ROUTINE X 2)     Status: Normal (Preliminary result)   Collection Time   09/21/10 12:15 AM      Component Value Range Status Comment   Specimen Description BLOOD LEFT HAND   Final    Special Requests BOTTLES DRAWN AEROBIC AND ANAEROBIC 10CC   Final    Culture NO GROWTH 1 DAY   Final    Report Status PENDING   Incomplete   CULTURE, BLOOD (ROUTINE X 2)     Status: Normal (Preliminary result)   Collection Time   09/21/10 12:20 AM      Component Value Range Status Comment   Specimen Description BLOOD RIGHT ANTECUBITAL   Final    Special Requests BOTTLES DRAWN AEROBIC AND ANAEROBIC 10CC   Final    Culture NO GROWTH 1 DAY   Final    Report Status PENDING   Incomplete     Dg Chest 1 View  09/20/2010  *RADIOLOGY REPORT*  Clinical Data: Fever, syncope.  CHEST - 1 VIEW  Comparison: 06/26/2010  Findings: Large hiatal hernia.  Heart is borderline in size.  Low lung volumes.  Minimal bibasilar atelectasis.  No effusions or acute bony abnormality.   IMPRESSION: Low lung volumes, bibasilar atelectasis.  Hiatal hernia.  Original Report Authenticated By: Cyndie Chime, M.D.   Ct Head Wo Contrast  09/20/2010  *RADIOLOGY REPORT*  Clinical Data: Loss of consciousness  CT HEAD WITHOUT CONTRAST  Technique:  Contiguous axial images were obtained from the base of the skull through the vertex without contrast.  Comparison: 01/30/2009  Findings: Mild prominence of the sulci, cisterns, and  ventricles, in keeping with volume loss. There are mild periventricular white matter hypodensities, a nonspecific finding most often seen with chronic microangiopathic changes.  There is no evidence for acute hemorrhage, overt hydrocephalus, mass lesion, or abnormal extra-axial fluid collection.  No definite CT evidence for acute cortical based (large artery) infarction. The visualized paranasal sinuses and mastoid air cells are predominately clear.  IMPRESSION: No definite acute intracranial abnormality.  Original Report Authenticated By: Waneta Martins, M.D.      Medications: I have reviewed the patient's current medications.  Impression: 1. Fever, likely secondary to UTI. 2. Orthostatic hypotension, chronic problem. 3. Autoimmune hepatitis.     Plan: 1. Continue current treatment. Observe for fevers. 2. Reduce intravenous fluids. 3. Reduce oral prednisone to 10 mg daily. 4. Hopefully discharge home tomorrow if there are no further fevers.     LOS: 2 days   Bonnie Curtis 09/22/2010, 7:36 AM

## 2010-09-23 ENCOUNTER — Inpatient Hospital Stay (HOSPITAL_COMMUNITY): Payer: Medicare Other

## 2010-09-23 LAB — BASIC METABOLIC PANEL
BUN: 28 mg/dL — ABNORMAL HIGH (ref 6–23)
Calcium: 8.2 mg/dL — ABNORMAL LOW (ref 8.4–10.5)
Creatinine, Ser: 0.99 mg/dL (ref 0.50–1.10)
GFR calc non Af Amer: 53 mL/min — ABNORMAL LOW (ref >60)
Glucose, Bld: 101 mg/dL — ABNORMAL HIGH (ref 70–99)
Sodium: 125 mEq/L — ABNORMAL LOW (ref 135–145)

## 2010-09-23 MED ORDER — MOXIFLOXACIN HCL 400 MG PO TABS: 400.0000 mg | ORAL_TABLET | Freq: Every day | ORAL | Status: DC

## 2010-09-23 MED ORDER — SODIUM CHLORIDE 0.9 % IJ SOLN
INTRAMUSCULAR | Status: AC
  Filled 2010-09-23: qty 10

## 2010-09-23 MED ORDER — MOXIFLOXACIN HCL 400 MG PO TABS: 400.0000 mg | ORAL_TABLET | Freq: Every day | ORAL | Status: AC

## 2010-09-23 MED ORDER — FUROSEMIDE 10 MG/ML IJ SOLN
40.0000 mg | Freq: Two times a day (BID) | INTRAMUSCULAR | Status: DC
  Administered 2010-09-24: 40 mg via INTRAVENOUS
  Filled 2010-09-23: qty 4

## 2010-09-23 MED ORDER — IOHEXOL 350 MG/ML SOLN
100.0000 mL | Freq: Once | INTRAVENOUS | Status: AC | PRN
  Administered 2010-09-23: 100 mL via INTRAVENOUS

## 2010-09-23 MED ORDER — CYANOCOBALAMIN 1000 MCG/ML IJ SOLN
1000.0000 ug | Freq: Once | INTRAMUSCULAR | Status: AC
  Administered 2010-09-23: 1000 ug via INTRAMUSCULAR
  Filled 2010-09-23: qty 1

## 2010-09-23 MED ORDER — CYANOCOBALAMIN 1000 MCG/ML IJ SOLN: 1000.0000 ug | Freq: Once | INTRAMUSCULAR | Status: DC

## 2010-09-23 NOTE — Progress Notes (Signed)
*  PRELIMINARY RESULTS* Echocardiogram Echocardiogram was cancelled/ Dr.Sullivan pt going home   Bonnie Curtis, Bonnie Curtis 09/23/2010, 2:10 PM

## 2010-09-23 NOTE — Progress Notes (Signed)
UR Chart Review Completed  

## 2010-09-23 NOTE — Progress Notes (Signed)
Lasix held per Dr. Blair Dolphin order for BP 91/53.

## 2010-09-23 NOTE — Discharge Summary (Signed)
Physician Discharge Summary  Patient ID: Bonnie Curtis MRN: [id number] DOB/AGE: 07/16/1924 77 y.o. Primary Care Physician:FAGAN,ROY, MD Admit date: 09/20/2010 Discharge date: 09/23/2010    Discharge Diagnoses:  1. Fever, likely secondary to UTI, resolved. 2. Orthostatic hypotension, chronic problem. 3. Autoimmune hepatitis, on immunosuppressive therapy as well as steroids. 4. Hypothyroidism. 5. Deconditioning.  Current Discharge Medication List    START taking these medications   Details  cyanocobalamin (,VITAMIN B-12,) 1000 MCG/ML injection Inject 1 mL (1,000 mcg total) into the muscle once. Qty: 1 mL, Refills: 0    moxifloxacin (AVELOX) 400 MG tablet Take 1 tablet (400 mg total) by mouth daily. Qty: 5 tablet, Refills: 0      CONTINUE these medications which have NOT CHANGED   Details  aspirin EC 81 MG tablet Take 81 mg by mouth daily.      azaTHIOprine (IMURAN) 50 MG tablet Take 50 mg by mouth daily.     calcium-vitamin D (OSCAL WITH D) 500-200 MG-UNIT per tablet Take 1 tablet by mouth daily.      ibuprofen (ADVIL,MOTRIN) 200 MG tablet Take 400 mg by mouth every 6 (six) hours as needed. Pain     levothyroxine (SYNTHROID, LEVOTHROID) 88 MCG tablet Take 88 mcg by mouth daily. For thyroid    methylcellulose (ARTIFICIAL TEARS) 1 % ophthalmic solution Place 1 drop into both eyes daily as needed. Dry Eyes     omeprazole (PRILOSEC) 40 MG capsule Take 40 mg by mouth 2 (two) times daily. For acid refux    polyethylene glycol (MIRALAX / GLYCOLAX) packet Take 17 g by mouth daily as needed. Constipation     predniSONE (DELTASONE) 10 MG tablet Take 5-10 mg by mouth daily. Patient is taking a taper. Took 10 mg daily for 14 days. Now is taking 5 mg daily starting on 09/17/10, to take for 14 days.     traMADol (ULTRAM) 50 MG tablet Take 50 mg by mouth every 6 (six) hours as needed. For pain    VITAMIN B1-B12 IJ Inject 1 each as directed every 30 (thirty) days. Last injection 08/22/2010        STOP taking these medications     cephALEXin (KEFLEX) 500 MG capsule      cephALEXin (KEFLEX) 500 MG capsule      furosemide (LASIX) 40 MG tablet      polyethylene glycol powder (GLYCOLAX/MIRALAX) powder         Discharged Condition: Stable and improved.    Consults: None.  Significant Diagnostic Studies: Dg Chest 1 View  09/20/2010  *RADIOLOGY REPORT*  Clinical Data: Fever, syncope.  CHEST - 1 VIEW  Comparison: 06/26/2010  Findings: Large hiatal hernia.  Heart is borderline in size.  Low lung volumes.  Minimal bibasilar atelectasis.  No effusions or acute bony abnormality.  IMPRESSION: Low lung volumes, bibasilar atelectasis.  Hiatal hernia.  Original Report Authenticated By: Cyndie Chime, M.D.   Ct Head Wo Contrast  09/20/2010  *RADIOLOGY REPORT*  Clinical Data: Loss of consciousness  CT HEAD WITHOUT CONTRAST  Technique:  Contiguous axial images were obtained from the base of the skull through the vertex without contrast.  Comparison: 01/30/2009  Findings: Mild prominence of the sulci, cisterns, and ventricles, in keeping with volume loss. There are mild periventricular white matter hypodensities, a nonspecific finding most often seen with chronic microangiopathic changes.  There is no evidence for acute hemorrhage, overt hydrocephalus, mass lesion, or abnormal extra-axial fluid collection.  No definite CT evidence for acute cortical based (  large artery) infarction. The visualized paranasal sinuses and mastoid air cells are predominately clear.  IMPRESSION: No definite acute intracranial abnormality.  Original Report Authenticated By: Waneta Martins, M.D.   Ct Soft Tissue Neck W Contrast  09/22/2010  *RADIOLOGY REPORT*  Clinical Data: Severe pharyngitis.  CT NECK WITH CONTRAST  Technique:  Multidetector CT imaging of the neck was performed with intravenous contrast.  Contrast: 75 ml Omnipaque-300  Comparison: None.  Findings: Normal appearing prevertebral soft tissues  without soft tissue swelling.  No fluid collections or enlarged lymph nodes. Bilateral carotid artery atheromatous calcifications.  Multilevel degenerative changes in the cervical spine.  Clear lung apices.  IMPRESSION:  1.  No retropharyngeal abscess or acute abnormality. 2.  Bilateral carotid artery atheromatous calcifications.  This was evaluated with carotid ultrasound on 01/31/2009. 3.  Cervical spine degenerative changes.  Original Report Authenticated By: Darrol Angel, M.D.    Lab Results: Results for orders placed during the hospital encounter of 09/20/10 (from the past 48 hour(s))  CARDIAC PANEL(CRET KIN+CKTOT+MB+TROPI)     Status: Normal   Collection Time   09/21/10  3:50 PM      Component Value Range Comment   Total CK 67  7 - 177 (U/L)    CK, MB 3.7  0.3 - 4.0 (ng/mL)    Troponin I <0.30  <0.30 (ng/mL)    Relative Index RELATIVE INDEX IS INVALID  0.0 - 2.5    CARDIAC PANEL(CRET KIN+CKTOT+MB+TROPI)     Status: Normal   Collection Time   09/22/10  5:23 AM      Component Value Range Comment   Total CK 36  7 - 177 (U/L)    CK, MB 3.6  0.3 - 4.0 (ng/mL)    Troponin I <0.30  <0.30 (ng/mL)    Relative Index RELATIVE INDEX IS INVALID  0.0 - 2.5    COMPREHENSIVE METABOLIC PANEL     Status: Abnormal   Collection Time   09/22/10  5:24 AM      Component Value Range Comment   Sodium 129 (*) 135 - 145 (mEq/L)    Potassium 4.0  3.5 - 5.1 (mEq/L)    Chloride 100  96 - 112 (mEq/L)    CO2 22  19 - 32 (mEq/L)    Glucose, Bld 107 (*) 70 - 99 (mg/dL)    BUN 29 (*) 6 - 23 (mg/dL)    Creatinine, Ser 1.61  0.50 - 1.10 (mg/dL)    Calcium 8.5  8.4 - 10.5 (mg/dL)    Total Protein 5.7 (*) 6.0 - 8.3 (g/dL)    Albumin 2.3 (*) 3.5 - 5.2 (g/dL)    AST 49 (*) 0 - 37 (U/L)    ALT 27  0 - 35 (U/L)    Alkaline Phosphatase 89  39 - 117 (U/L)    Total Bilirubin 0.5  0.3 - 1.2 (mg/dL)    GFR calc non Af Amer 49 (*) >60 (mL/min)    GFR calc Af Amer 60 (*) >60 (mL/min)   CBC     Status: Abnormal    Collection Time   09/22/10  5:24 AM      Component Value Range Comment   WBC 12.2 (*) 4.0 - 10.5 (K/uL)    RBC 3.79 (*) 3.87 - 5.11 (MIL/uL)    Hemoglobin 11.9 (*) 12.0 - 15.0 (g/dL)    HCT 09.6 (*) 04.5 - 46.0 (%)    MCV 87.6  78.0 - 100.0 (fL)    MCH  31.4  26.0 - 34.0 (pg)    MCHC 35.8  30.0 - 36.0 (g/dL)    RDW 16.1  09.6 - 04.5 (%)    Platelets 70 (*) 150 - 400 (K/uL) PLATELET COUNT CONFIRMED BY SMEAR  BASIC METABOLIC PANEL     Status: Abnormal   Collection Time   09/23/10  4:57 AM      Component Value Range Comment   Sodium 125 (*) 135 - 145 (mEq/L)    Potassium 4.0  3.5 - 5.1 (mEq/L)    Chloride 100  96 - 112 (mEq/L)    CO2 20  19 - 32 (mEq/L)    Glucose, Bld 101 (*) 70 - 99 (mg/dL)    BUN 28 (*) 6 - 23 (mg/dL)    Creatinine, Ser 4.09  0.50 - 1.10 (mg/dL)    Calcium 8.2 (*) 8.4 - 10.5 (mg/dL)    GFR calc non Af Amer 53 (*) >60 (mL/min)    GFR calc Af Amer >60  >60 (mL/min)    Recent Results (from the past 240 hour(s))  CULTURE, BLOOD (ROUTINE X 2)     Status: Normal (Preliminary result)   Collection Time   09/21/10 12:15 AM      Component Value Range Status Comment   Specimen Description BLOOD LEFT HAND   Final    Special Requests BOTTLES DRAWN AEROBIC AND ANAEROBIC 10CC   Final    Culture NO GROWTH 1 DAY   Final    Report Status PENDING   Incomplete   CULTURE, BLOOD (ROUTINE X 2)     Status: Normal (Preliminary result)   Collection Time   09/21/10 12:20 AM      Component Value Range Status Comment   Specimen Description BLOOD RIGHT ANTECUBITAL   Final    Special Requests BOTTLES DRAWN AEROBIC AND ANAEROBIC 10CC   Final    Culture NO GROWTH 1 DAY   Final    Report Status PENDING   Incomplete      Hospital Course: This very pleasant 77 year old lady was admitted with she episode of lightheadedness, dizziness and brief loss of consciousness. Please see initial history and physical examination done by Dr. Vania Rea. The patient also had a fever on admission. The  patient had previously been treated for a UTI prior to hospitalization. Urine cultures were not taken but  blood cultures were negative. Her steroids, were increased to prednisone 15 mg daily and rapidly decreased to her baseline which is 5 mg daily. She did have hyponatremia which I think will resolve as her prednisone decreases. She feels well today although somewhat weak. I think she is deconditioned and requires physical therapy. She may also require a higher level of care from going from a home situation to an assisted living facility. Her daughter, who lives near [place], is planning to eventually move her to an assisted living facility in [place].  Discharge Exam: Blood pressure 91/54, pulse 74, temperature 98 F (36.7 C), temperature source Oral, resp. rate 24, height 5\' 7"  (1.702 m), weight 79.289 kg (174 lb 12.8 oz), SpO2 95.00%. She looks systemically well, heart sounds are present and normal. Lung fields are clear. She is alert and orientated without any focal neurological signs. Her blood pressure does fluctuate even during this hospitalization and this is normal for her at home.  Disposition: Home. She will followup with her gastroenterologist this week in [place] and he eventually the plan is for her to move to [place].  Discharge Orders  Future Orders Please Complete By Expires   Diet - low sodium heart healthy      Increase activity slowly           Signed: GOSRANI,NIMISH C 09/23/2010, 12:41 PM

## 2010-09-23 NOTE — Progress Notes (Signed)
Pt d/c home today arranged per daughter she will be [place]. On Wednesday arranged hh services with [facility] at request of daughter. Per Herbert Seta at [employer] [employer] will see [place] on Wednesday afternoon.

## 2010-09-24 LAB — BASIC METABOLIC PANEL
CO2: 20 mEq/L (ref 19–32)
Chloride: 101 mEq/L (ref 96–112)
GFR calc Af Amer: >60 mL/min (ref >60)
Potassium: 4.2 mEq/L (ref 3.5–5.1)

## 2010-09-24 LAB — CBC
HCT: 29 % — ABNORMAL LOW (ref 36.0–46.0)
Hemoglobin: 10.4 g/dL — ABNORMAL LOW (ref 12.0–15.0)
MCV: 86.6 fL (ref 78.0–100.0)
RBC: 3.35 MIL/uL — ABNORMAL LOW (ref 3.87–5.11)
WBC: 9.4 10*3/uL (ref 4.0–10.5)

## 2010-09-24 MED ORDER — SPIRONOLACTONE 25 MG PO TABS: 25.0000 mg | ORAL_TABLET | Freq: Every day | ORAL | Status: DC

## 2010-09-24 NOTE — Progress Notes (Signed)
Discharge instructions where given on medications,and follow up visits,patient,and family verbalized understanding.Prescriptions sent with patient. No c/o pain or discomfort noted.Accompanied by staff to an awaiting vechicle.

## 2010-09-24 NOTE — Discharge Summary (Signed)
Physician Discharge Summary  Patient ID: Bonnie Curtis MRN: [id number] DOB/AGE: 05-21-1924 77 y.o. Primary Care Physician:Dr Dwana Melena Admit date: 09/20/2010 Discharge date: 09/24/2010    Discharge Diagnoses:  1. Fever, likely secondary to UTI, resolved. 2. Orthostatic hypotension, chronic problem. 3. Autoimmune hepatitis with likely cirrhosis of the liver and decompensated chronic liver disease. On immunosuppressive therapy as well as steroids. 4. Hypothyroidism. 5. Deconditioning.   Current Discharge Medication List    START taking these medications   Details  cyanocobalamin (,VITAMIN B-12,) 1000 MCG/ML injection Inject 1 mL (1,000 mcg total) into the muscle once. Qty: 1 mL, Refills: 0    moxifloxacin (AVELOX) 400 MG tablet Take 1 tablet (400 mg total) by mouth daily. Qty: 5 tablet, Refills: 0    spironolactone (ALDACTONE) 25 MG tablet Take 1 tablet (25 mg total) by mouth daily. Qty: 30 tablet, Refills: 0      CONTINUE these medications which have NOT CHANGED   Details  aspirin EC 81 MG tablet Take 81 mg by mouth daily.      azaTHIOprine (IMURAN) 50 MG tablet Take 50 mg by mouth daily.     calcium-vitamin D (OSCAL WITH D) 500-200 MG-UNIT per tablet Take 1 tablet by mouth daily.      ibuprofen (ADVIL,MOTRIN) 200 MG tablet Take 400 mg by mouth every 6 (six) hours as needed. Pain     levothyroxine (SYNTHROID, LEVOTHROID) 88 MCG tablet Take 88 mcg by mouth daily. For thyroid    methylcellulose (ARTIFICIAL TEARS) 1 % ophthalmic solution Place 1 drop into both eyes daily as needed. Dry Eyes     omeprazole (PRILOSEC) 40 MG capsule Take 40 mg by mouth 2 (two) times daily. For acid refux    polyethylene glycol (MIRALAX / GLYCOLAX) packet Take 17 g by mouth daily as needed. Constipation     predniSONE (DELTASONE) 10 MG tablet Take 5-10 mg by mouth daily. Patient is taking a taper. Took 10 mg daily for 14 days. Now is taking 5 mg daily starting on 09/17/10, to take for 14 days.       traMADol (ULTRAM) 50 MG tablet Take 50 mg by mouth every 6 (six) hours as needed. For pain    VITAMIN B1-B12 IJ Inject 1 each as directed every 30 (thirty) days. Last injection 08/22/2010       STOP taking these medications     cephALEXin (KEFLEX) 500 MG capsule      cephALEXin (KEFLEX) 500 MG capsule      furosemide (LASIX) 40 MG tablet      polyethylene glycol powder (GLYCOLAX/MIRALAX) powder         Discharged Condition: Stable and improved.    Consults: None.  Significant Diagnostic Studies: Dg Chest 1 View  09/20/2010  *RADIOLOGY REPORT*  Clinical Data: Fever, syncope.  CHEST - 1 VIEW  Comparison: 06/26/2010  Findings: Large hiatal hernia.  Heart is borderline in size.  Low lung volumes.  Minimal bibasilar atelectasis.  No effusions or acute bony abnormality.  IMPRESSION: Low lung volumes, bibasilar atelectasis.  Hiatal hernia.  Original Report Authenticated By: Cyndie Chime, M.D.   Ct Head Wo Contrast  09/20/2010  *RADIOLOGY REPORT*  Clinical Data: Loss of consciousness  CT HEAD WITHOUT CONTRAST  Technique:  Contiguous axial images were obtained from the base of the skull through the vertex without contrast.  Comparison: 01/30/2009  Findings: Mild prominence of the sulci, cisterns, and ventricles, in keeping with volume loss. There are mild periventricular white matter hypodensities,  a nonspecific finding most often seen with chronic microangiopathic changes.  There is no evidence for acute hemorrhage, overt hydrocephalus, mass lesion, or abnormal extra-axial fluid collection.  No definite CT evidence for acute cortical based (large artery) infarction. The visualized paranasal sinuses and mastoid air cells are predominately clear.  IMPRESSION: No definite acute intracranial abnormality.  Original Report Authenticated By: Waneta Martins, M.D.   Ct Soft Tissue Neck W Contrast  09/22/2010  *RADIOLOGY REPORT*  Clinical Data: Severe pharyngitis.  CT NECK WITH CONTRAST   Technique:  Multidetector CT imaging of the neck was performed with intravenous contrast.  Contrast: 75 ml Omnipaque-300  Comparison: None.  Findings: Normal appearing prevertebral soft tissues without soft tissue swelling.  No fluid collections or enlarged lymph nodes. Bilateral carotid artery atheromatous calcifications.  Multilevel degenerative changes in the cervical spine.  Clear lung apices.  IMPRESSION:  1.  No retropharyngeal abscess or acute abnormality. 2.  Bilateral carotid artery atheromatous calcifications.  This was evaluated with carotid ultrasound on 01/31/2009. 3.  Cervical spine degenerative changes.  Original Report Authenticated By: Darrol Angel, M.D.   Ct Angio Chest W/cm &/or Wo Cm  09/23/2010  *RADIOLOGY REPORT*  Clinical Data:  Shortness of breath, dizziness  CT ANGIOGRAPHY CHEST WITH CONTRAST  Technique:  Multidetector CT imaging of the chest was performed using the standard protocol during bolus administration of intravenous contrast.  Multiplanar CT image reconstructions including MIPs were obtained to evaluate the vascular anatomy.  Contrast:  100 ml Omnipaque 350 IV contrast  Comparison:  Chest radiograph same date, chest CT 12/07/2008  Findings:  Subjective diffuse thyromegaly again noted.  1 cm pretracheal node image 29 is stable. 0.9 cm right hilar lymph node image 49 is slightly smaller.  Moderate sized hiatal hernia again noted.  Heart size is upper limits of normal.  Coronary arterial calcifications are present.  Great vessels are normal in caliber.  New moderate sized bilateral pleural effusions are noted with associated compressive atelectasis.  Incomplete imaging of the upper abdomen demonstrates ascites and nodular hepatic contour which may suggest cirrhosis.  Atherosclerotic aortic calcifications are noted with areas of mildly irregular hypodense plaque especially at the level of the diaphragmatic hiatus, which may indicate ulceration and potential plaque instability (for  example image 77).  The examination is adequate for evaluation for acute pulmonary embolism up to the 3rd order pulmonary arteries, although more heterogeneous distal opacification renders evaluation suboptimal for pulmonary embolism past the level of the 3rd order pulmonary arteries.  No focal filling defect is seen to suggest acute pulmonary embolism.  Great vessels are normal in caliber.  0.6 cm right upper lobe pulmonary nodule image 27 is stable. Imaging through the lung fields is degraded by respiratory motion.  No acute osseous abnormality.  Mild central loss of height at T5 is stable.  Review of the MIP images confirms the above findings.  IMPRESSION: New moderate bilateral low-density pleural effusions with associated compressive atelectasis.  This may account for the history shortness of breath.  Presence of abdominal ascites and hepatic nodularity may suggest cirrhosis or third spacing/hypoproteinemia.  Allowing for heterogeneity of the contrast bolus, no filling definite defect is seen to suggest pulmonary embolism up to the 3rd order pulmonary arteries.  Original Report Authenticated By: Harrel Lemon, M.D.   Dg Chest Portable 1 View  09/23/2010  *RADIOLOGY REPORT*  Clinical Data: Hypoxia  PORTABLE CHEST - 1 VIEW  Comparison: Portable exam 1539 hours compared to 09/20/2010 Correlation:  CT  chest 12/07/2008  Findings: Enlargement of cardiac silhouette. Elongation of thoracic aorta. Large hiatal hernia, noted on a prior CT. Pulmonary vascularity and mediastinal contours otherwise normal. Minimal bibasilar atelectasis. Minimal atelectasis and/or scarring in right upper lobe above minor fissure. Lungs otherwise clear. 8 x 7 mm diameter right upper lobe pulmonary nodule, corresponding in position with a nodule seen on the prior CT, not significantly changed accounting for magnification inherent to radiography. Bones demineralized.  IMPRESSION: Enlargement of cardiac silhouette. Large hiatal hernia.  Minimal atelectasis and/or scarring as above. 8 x 7 mm right upper lobe pulmonary nodule.  Original Report Authenticated By: Lollie Marrow, M.D.    Lab Results: Results for orders placed during the hospital encounter of 09/20/10 (from the past 48 hour(s))  BASIC METABOLIC PANEL     Status: Abnormal   Collection Time   09/23/10  4:57 AM      Component Value Range Comment   Sodium 125 (*) 135 - 145 (mEq/L)    Potassium 4.0  3.5 - 5.1 (mEq/L)    Chloride 100  96 - 112 (mEq/L)    CO2 20  19 - 32 (mEq/L)    Glucose, Bld 101 (*) 70 - 99 (mg/dL)    BUN 28 (*) 6 - 23 (mg/dL)    Creatinine, Ser 1.61  0.50 - 1.10 (mg/dL)    Calcium 8.2 (*) 8.4 - 10.5 (mg/dL)    GFR calc non Af Amer 53 (*) >60 (mL/min)    GFR calc Af Amer >60  >60 (mL/min)   BASIC METABOLIC PANEL     Status: Abnormal   Collection Time   09/24/10  5:24 AM      Component Value Range Comment   Sodium 126 (*) 135 - 145 (mEq/L)    Potassium 4.2  3.5 - 5.1 (mEq/L)    Chloride 101  96 - 112 (mEq/L)    CO2 20  19 - 32 (mEq/L)    Glucose, Bld 96  70 - 99 (mg/dL)    BUN 24 (*) 6 - 23 (mg/dL)    Creatinine, Ser 0.96  0.50 - 1.10 (mg/dL)    Calcium 8.7  8.4 - 10.5 (mg/dL)    GFR calc non Af Amer >60  >60 (mL/min)    GFR calc Af Amer >60  >60 (mL/min)   CBC     Status: Abnormal   Collection Time   09/24/10  5:24 AM      Component Value Range Comment   WBC 9.4  4.0 - 10.5 (K/uL)    RBC 3.35 (*) 3.87 - 5.11 (MIL/uL)    Hemoglobin 10.4 (*) 12.0 - 15.0 (g/dL)    HCT 04.5 (*) 40.9 - 46.0 (%)    MCV 86.6  78.0 - 100.0 (fL)    MCH 31.0  26.0 - 34.0 (pg)    MCHC 35.9  30.0 - 36.0 (g/dL)    RDW 81.1  91.4 - 78.2 (%)    Platelets 82 (*) 150 - 400 (K/uL)    Recent Results (from the past 240 hour(s))  CULTURE, BLOOD (ROUTINE X 2)     Status: Normal (Preliminary result)   Collection Time   09/21/10 12:15 AM      Component Value Range Status Comment   Specimen Description BLOOD LEFT HAND   Final    Special Requests BOTTLES DRAWN AEROBIC  AND ANAEROBIC 10CC   Final    Culture NO GROWTH 2 DAYS   Final    Report Status PENDING  Incomplete   CULTURE, BLOOD (ROUTINE X 2)     Status: Normal (Preliminary result)   Collection Time   09/21/10 12:20 AM      Component Value Range Status Comment   Specimen Description BLOOD RIGHT ANTECUBITAL   Final    Special Requests BOTTLES DRAWN AEROBIC AND ANAEROBIC 10CC   Final    Culture NO GROWTH 2 DAYS   Final    Report Status PENDING   Incomplete      Hospital Course: This pleasant 77 year old lady was admitted with episode of lightheadedness, dizziness and brief loss of consciousness. Please see initial history and physical examination done by Dr. Vania Rea. The patient also had a fever on admission. The patient had previously been treated for UTI prior to hospitalization. Urine cultures were not taken but blood cultures were negative. Her steroids were increased to prednisone 15 mg daily and rapidly decreased to her baseline which is 5 mg daily. She did have  hyponatremia which is likely due to a combination of chronic liver disease and steroids. Yesterday, she was going to be discharged home, but she desaturated and investigations consisting of a CT chest scan confirmed the presence of bilateral pleural effusions, ascites and a liver that looked like it was cirrhotic. She was therefore given intravenous Lasix and today she feels improved. She still requires oxygen at 2 L per minute. She is due to see her gastroenterologist in [place] in the next few days. She will also require higher level of care from going from a home situation to an assisted living facility. Her daughter, who lives near [place], is planning to eventually move her to an assisted living facility in [place].  Discharge Exam: Blood pressure 102/64, pulse 64, temperature 97.9 F (36.6 C), temperature source Oral, resp. rate 20, height 5\' 7"  (1.702 m), weight 78.8 kg (173 lb 11.6 oz), SpO2 94.00%. She looks systemically  well today and hemodynamically stable. Lung fields are clear but decreased air entry in both bases. She is alert and orientated without any focal neurological signs. She does have peripheral pitting edema in her lower legs.  Disposition: Home.  Discharge Orders    Future Orders Please Complete By Expires   For home use only DME oxygen      Comments:   Patient has saturation of 87% on room air. She will require 2 L of oxygen per minute.   Diet - low sodium heart healthy      Diet - low sodium heart healthy      Increase activity slowly      Increase activity slowly         Follow-up Information    Follow up with [facility] [phone].         SignedWilson Singer 09/24/2010, 9:02 AM

## 2010-09-26 LAB — CULTURE, BLOOD (ROUTINE X 2)
Culture: NO GROWTH
Culture: NO GROWTH

## 2010-10-07 NOTE — Progress Notes (Signed)
Encounter addended by: Clarene Critchley on: 10/07/2010 11:44 AM<BR>     Documentation filed: Flowsheet VN

## 2010-10-24 ENCOUNTER — Other Ambulatory Visit (HOSPITAL_COMMUNITY): Payer: Self-pay | Admitting: Internal Medicine

## 2010-10-24 ENCOUNTER — Ambulatory Visit (HOSPITAL_COMMUNITY)
Admission: RE | Admit: 2010-10-24 | Discharge: 2010-10-24 | Disposition: A | Discharge status: Home / Self Care | Payer: Medicare Other | Source: Ambulatory Visit | Attending: Internal Medicine | Admitting: Internal Medicine

## 2010-10-24 DIAGNOSIS — Z139 Encounter for screening, unspecified: Secondary | ICD-10-CM

## 2010-10-24 DIAGNOSIS — Z1231 Encounter for screening mammogram for malignant neoplasm of breast: Secondary | ICD-10-CM | POA: Insufficient documentation

## 2011-02-25 IMAGING — US US RENAL
1 series · 14 of 24 positions shown · non-contrast
Comparison: None

CLINICAL DATA: Recurrent UTI

RENAL/URINARY TRACT ULTRASOUND
TECHNIQUE: Renal ultrasound

[Series 1: us renal · 0.28mm/px · 14 of 24 slices shown]
[im 1/24]
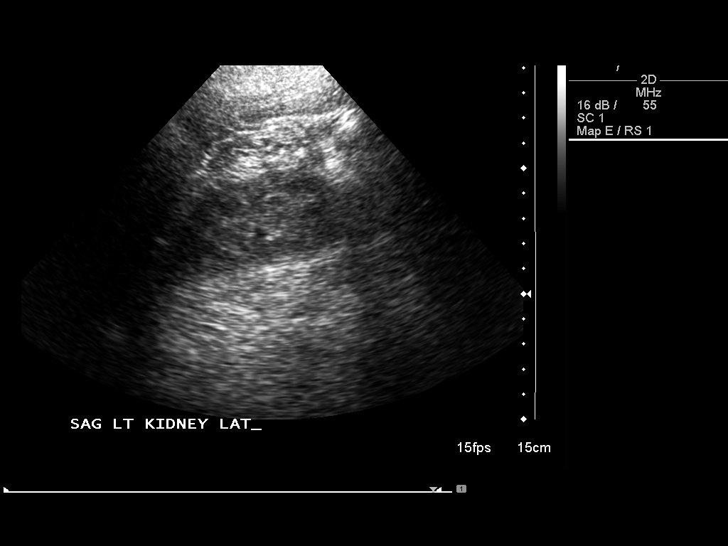
[im 3/24]
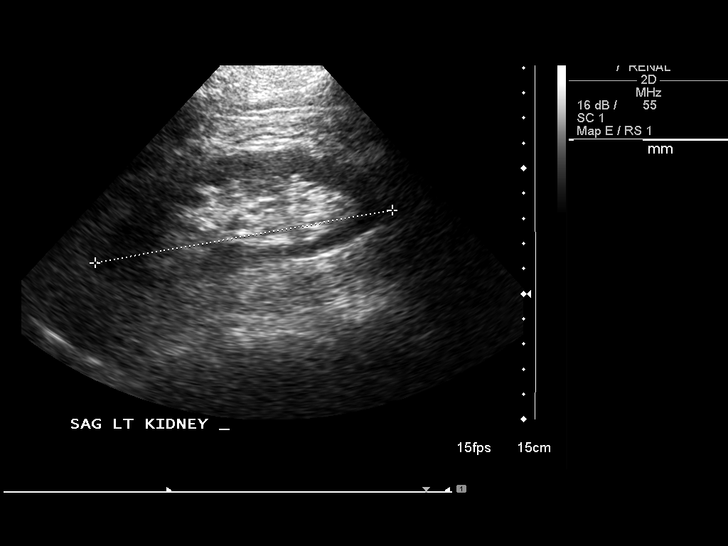
[im 5/24]
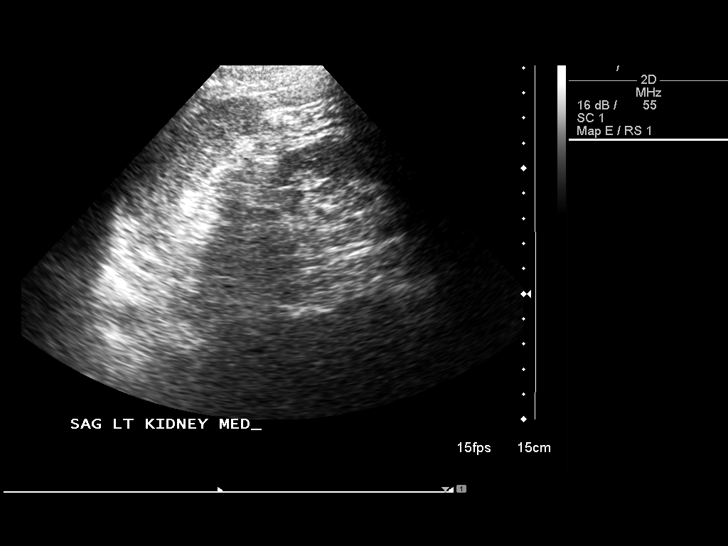
[im 7/24]
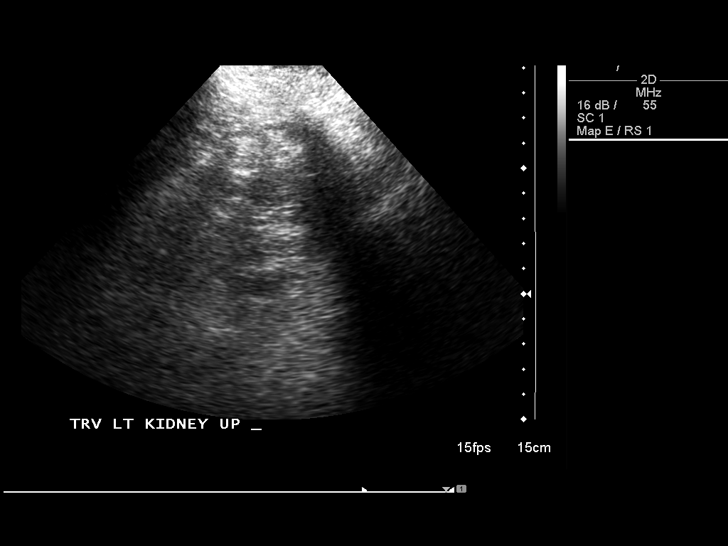
[im 8/24]
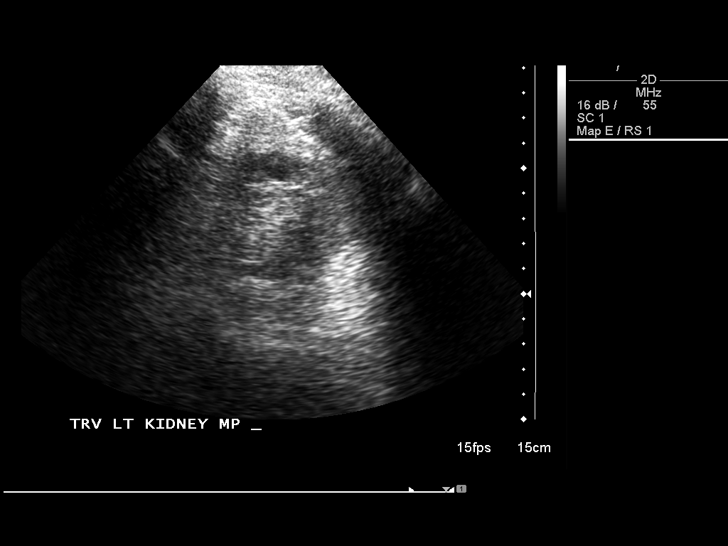
[im 10/24]
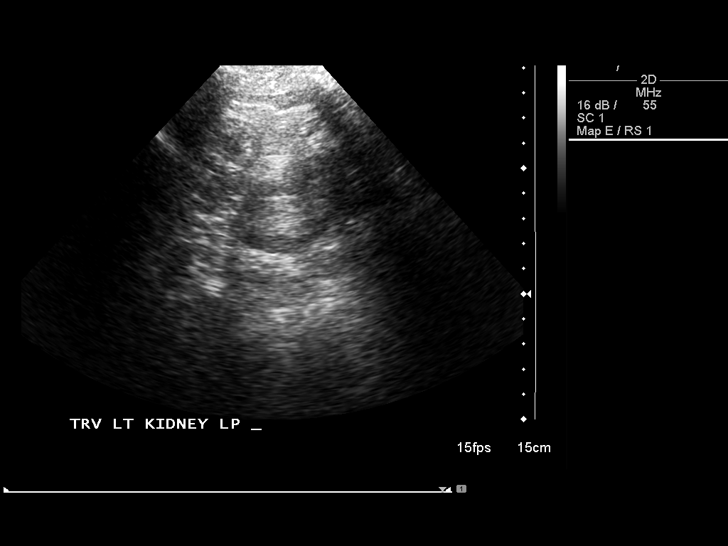
[im 12/24]
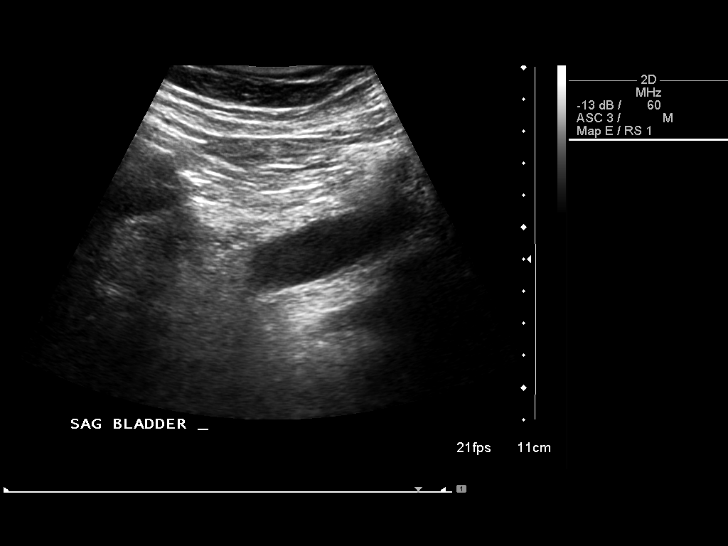
[im 13/24]
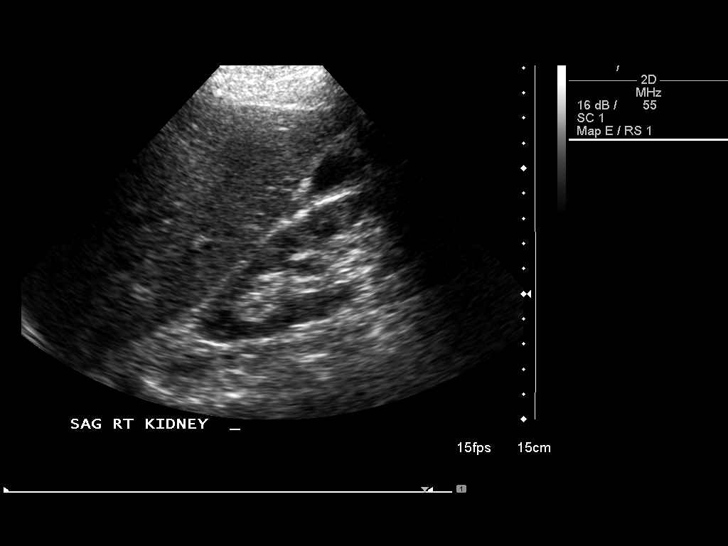
[im 15/24]
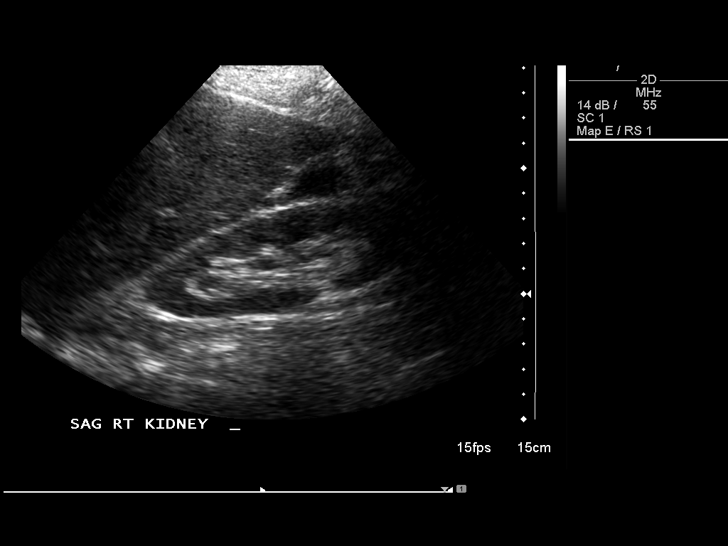
[im 17/24]
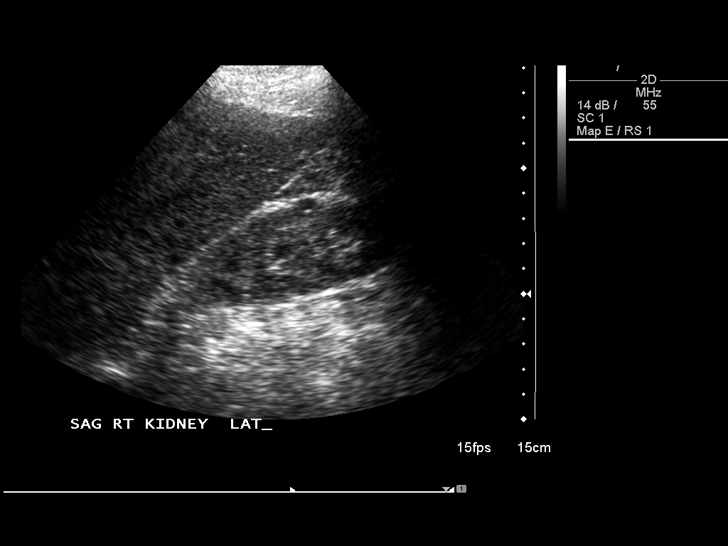
[im 19/24]
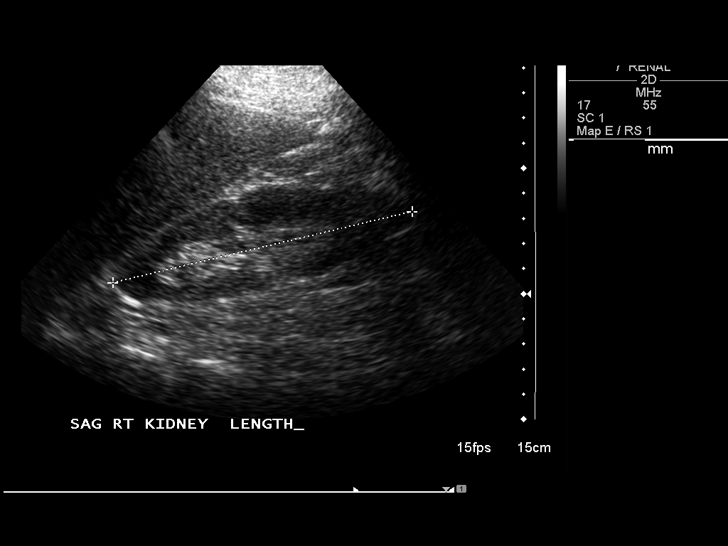
[im 20/24]
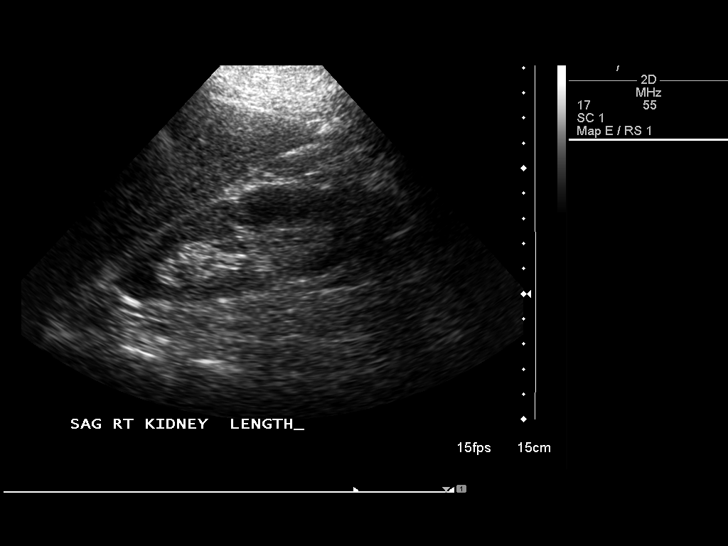
[im 22/24]
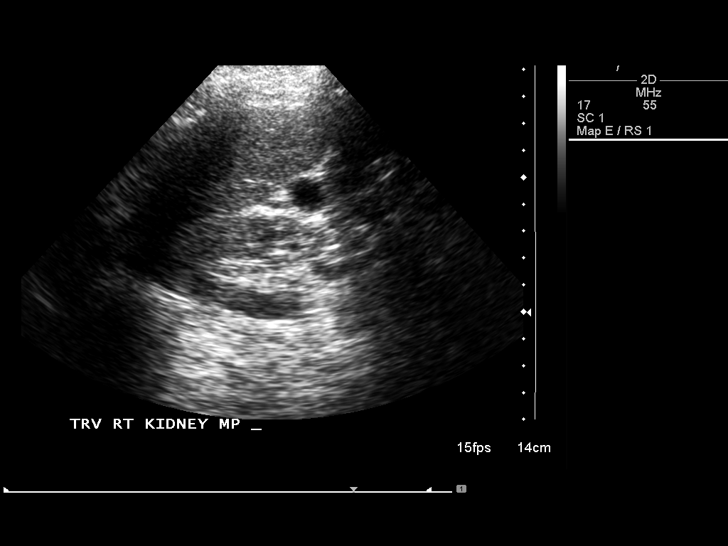
[im 24/24]
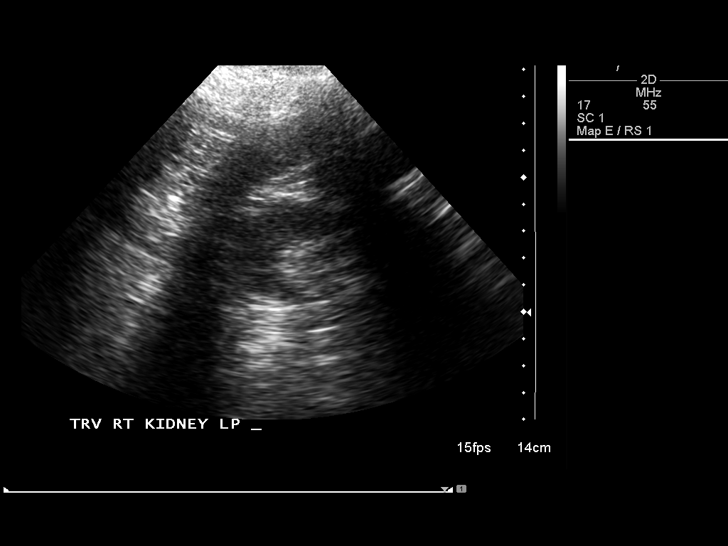

[14 of 24 positions shown; findings below may reference images not displayed]

FINDINGS: The study is limited by abundant bowel gas.  The right
kidney measures 12.3 cm in length.  No hydronephrosis or diagnostic
renal calculus.  There is cortical thinning probable due to
atrophy.

The left kidney measures 12 cm in length.  No hydronephrosis or
diagnostic renal calculus.  There is cortical thinning probable due
to atrophy.

The visualized urinary bladder is unremarkable.
IMPRESSION: No hydronephrosis or diagnostic renal calculus.  Bilateral renal
cortical thinning probable due to atrophy.

## 2014-07-09 ENCOUNTER — Emergency Department (HOSPITAL_COMMUNITY): Payer: Medicare Other

## 2014-07-09 ENCOUNTER — Emergency Department (HOSPITAL_COMMUNITY)
Admission: EM | Admit: 2014-07-09 | Discharge: 2014-07-09 | Disposition: A | Discharge status: Home / Self Care | Payer: Medicare Other | Attending: Emergency Medicine | Admitting: Emergency Medicine

## 2014-07-09 ENCOUNTER — Encounter (HOSPITAL_COMMUNITY): Payer: Self-pay | Admitting: Emergency Medicine

## 2014-07-09 DIAGNOSIS — Z791 Long term (current) use of non-steroidal anti-inflammatories (NSAID): Secondary | ICD-10-CM | POA: Diagnosis not present

## 2014-07-09 DIAGNOSIS — Z79899 Other long term (current) drug therapy: Secondary | ICD-10-CM | POA: Insufficient documentation

## 2014-07-09 DIAGNOSIS — Z7982 Long term (current) use of aspirin: Secondary | ICD-10-CM | POA: Diagnosis not present

## 2014-07-09 DIAGNOSIS — Z8679 Personal history of other diseases of the circulatory system: Secondary | ICD-10-CM | POA: Insufficient documentation

## 2014-07-09 DIAGNOSIS — E079 Disorder of thyroid, unspecified: Secondary | ICD-10-CM | POA: Diagnosis not present

## 2014-07-09 DIAGNOSIS — K219 Gastro-esophageal reflux disease without esophagitis: Secondary | ICD-10-CM | POA: Diagnosis not present

## 2014-07-09 DIAGNOSIS — R531 Weakness: Secondary | ICD-10-CM

## 2014-07-09 DIAGNOSIS — R262 Difficulty in walking, not elsewhere classified: Secondary | ICD-10-CM | POA: Insufficient documentation

## 2014-07-09 DIAGNOSIS — Z87891 Personal history of nicotine dependence: Secondary | ICD-10-CM | POA: Diagnosis not present

## 2014-07-09 LAB — CBG MONITORING, ED: Glucose-Capillary: 83 mg/dL (ref 65–99)

## 2014-07-09 LAB — URINALYSIS, ROUTINE W REFLEX MICROSCOPIC
BILIRUBIN URINE: NEGATIVE
Glucose, UA: NEGATIVE mg/dL
HGB URINE DIPSTICK: NEGATIVE
KETONES UR: NEGATIVE mg/dL
Nitrite: NEGATIVE
PROTEIN: NEGATIVE mg/dL
Specific Gravity, Urine: 1.016 (ref 1.005–1.030)
Urobilinogen, UA: 0.2 mg/dL (ref 0.0–1.0)
pH: 7.5 (ref 5.0–8.0)

## 2014-07-09 LAB — CBC
HCT: 35.4 % — ABNORMAL LOW (ref 36.0–46.0)
Hemoglobin: 11.4 g/dL — ABNORMAL LOW (ref 12.0–15.0)
MCH: 28 pg (ref 26.0–34.0)
MCHC: 32.2 g/dL (ref 30.0–36.0)
MCV: 87 fL (ref 78.0–100.0)
Platelets: 159 10*3/uL (ref 150–400)
RBC: 4.07 MIL/uL (ref 3.87–5.11)
RDW: 13.9 % (ref 11.5–15.5)
WBC: 5.8 10*3/uL (ref 4.0–10.5)

## 2014-07-09 LAB — BASIC METABOLIC PANEL
Anion gap: 9 (ref 5–15)
BUN: 26 mg/dL — ABNORMAL HIGH (ref 6–20)
CHLORIDE: 101 mmol/L (ref 101–111)
CO2: 25 mmol/L (ref 22–32)
Calcium: 9.1 mg/dL (ref 8.9–10.3)
Creatinine, Ser: 0.93 mg/dL (ref 0.44–1.00)
GFR, EST NON AFRICAN AMERICAN: 54 mL/min — AB (ref >60)
Glucose, Bld: 103 mg/dL — ABNORMAL HIGH (ref 65–99)
POTASSIUM: 4.1 mmol/L (ref 3.5–5.1)
SODIUM: 135 mmol/L (ref 135–145)

## 2014-07-09 LAB — URINE MICROSCOPIC-ADD ON

## 2014-07-09 LAB — I-STAT CHEM 8, ED
BUN: 29 mg/dL — AB (ref 6–20)
Calcium, Ion: 1.22 mmol/L (ref 1.13–1.30)
Chloride: 102 mmol/L (ref 101–111)
Creatinine, Ser: 0.9 mg/dL (ref 0.44–1.00)
GLUCOSE: 101 mg/dL — AB (ref 65–99)
HCT: 38 % (ref 36.0–46.0)
Hemoglobin: 12.9 g/dL (ref 12.0–15.0)
Potassium: 4.2 mmol/L (ref 3.5–5.1)
Sodium: 136 mmol/L (ref 135–145)
TCO2: 20 mmol/L (ref 0–100)

## 2014-07-09 MED ORDER — CEPHALEXIN 250 MG PO CAPS: 250.0000 mg | ORAL_CAPSULE | Freq: Three times a day (TID) | ORAL | Status: DC

## 2014-07-09 NOTE — Discharge Instructions (Signed)
Increase antibotic to 3 times a day for one week. MRI of the brain showed no acute findings.  Urinalysis showed minimal evidence of infection. Labs were good.

## 2014-07-09 NOTE — ED Notes (Signed)
CBG 83 

## 2014-07-09 NOTE — ED Provider Notes (Addendum)
CSN: [id number]     Arrival date & time 07/09/14  0847 History   First MD Initiated Contact with Patient 07/09/14 0920     Chief Complaint  Patient presents with  . Weakness     (Consider location/radiation/quality/duration/timing/severity/associated sxs/prior Treatment) HPI..... Patient complains of generalized weakness.  No frank neurological deficits, fever, chills, chest pain, dysuria.  Questionable dyspnea. She apparently had an epidural injection on her left lower back several weeks ago for pain management issues. She lives in assisted living facility in [place]. She normally walks with a walker. Patient has had difficulty ambulating and family reports that her thinking and mentation is substandard  Past Medical History  Diagnosis Date  . Thyroid disease   . Hypotension   . GERD (gastroesophageal reflux disease)    Past Surgical History  Procedure Laterality Date  . Abdominal hysterectomy    . Hip surgery  10/23/09    left hip partial replacement, kept socket and had ball and shaft replaced   No family history on file. History  Substance Use Topics  . Smoking status: Former Games developer  . Smokeless tobacco: Not on file  . Alcohol Use: No   OB History    No data available     Review of Systems  All other systems reviewed and are negative.     Allergies  Review of patient's allergies indicates no known allergies.  Home Medications   Prior to Admission medications   Medication Sig Start Date End Date Taking? Authorizing Provider  alendronate (FOSAMAX) 70 MG tablet Take 70 mg by mouth once a week. Thursday 06/23/14  Yes Historical Provider, MD  aspirin EC 81 MG tablet Take 81 mg by mouth daily.     Yes Historical Provider, MD  calcium-vitamin D (OSCAL WITH D) 500-200 MG-UNIT per tablet Take 1 tablet by mouth daily.     Yes Historical Provider, MD  citalopram (CELEXA) 20 MG tablet Take 20 mg by mouth daily. 07/06/14  Yes Historical Provider, MD  Cranberry 200 MG CAPS  Take 400 mg by mouth daily.   Yes Historical Provider, MD  cyanocobalamin (,VITAMIN B-12,) 1000 MCG/ML injection Inject 1 mL (1,000 mcg total) into the muscle once. Patient taking differently: Inject 1,000 mcg into the muscle once. Every 15th of each month 09/23/10  Yes Nimish C Karilyn Cota, MD  furosemide (LASIX) 20 MG tablet Take 20 mg by mouth daily.   Yes Historical Provider, MD  gabapentin (NEURONTIN) 100 MG capsule Take 100 mg by mouth 2 (two) times daily. 06/12/14  Yes Historical Provider, MD  ibuprofen (ADVIL,MOTRIN) 800 MG tablet Take 1,600 mg by mouth every 6 (six) hours as needed for mild pain.   Yes Historical Provider, MD  levothyroxine (SYNTHROID, LEVOTHROID) 100 MCG tablet Take 100 mcg by mouth daily. 07/07/14  Yes Historical Provider, MD  meloxicam (MOBIC) 7.5 MG tablet Take 7.5 mg by mouth daily. 06/30/14  Yes Historical Provider, MD  mycophenolate (CELLCEPT) 250 MG capsule Take 250 mg by mouth 2 (two) times daily. 06/23/14  Yes Historical Provider, MD  omeprazole (PRILOSEC) 20 MG capsule Take 20 mg by mouth daily. 06/30/14  Yes Historical Provider, MD  polyethylene glycol (MIRALAX / GLYCOLAX) packet Take 17 g by mouth daily as needed. Constipation    Yes Historical Provider, MD  potassium chloride SA (K-DUR,KLOR-CON) 20 MEQ tablet Take 20 mEq by mouth daily. 06/16/14  Yes Historical Provider, MD  traMADol (ULTRAM) 50 MG tablet Take 50 mg by mouth every 6 (six) hours as needed.  For pain   Yes Historical Provider, MD  vitamin C (ASCORBIC ACID) 500 MG tablet Take 500 mg by mouth daily.   Yes Historical Provider, MD  cephALEXin (KEFLEX) 250 MG capsule Take 1 capsule (250 mg total) by mouth 3 (three) times daily. 07/09/14   Donnetta Hutching, MD  spironolactone (ALDACTONE) 25 MG tablet Take 1 tablet (25 mg total) by mouth daily. 09/24/10 09/24/11  Nimish C Karilyn Cota, MD   BP 137/68 mmHg  Pulse 64  Temp(Src) 97.4 F (36.3 C) (Oral)  Resp 15  Ht  (1.676 m)  Wt 170 lb (77.111 kg)  BMI 27.45 kg/m2   SpO2 93% Physical Exam  Constitutional: She is oriented to person, place, and time.  Frail, but alert  HENT:  Head: Normocephalic and atraumatic.  Eyes: Conjunctivae and EOM are normal. Pupils are equal, round, and reactive to light.  Neck: Normal range of motion. Neck supple.  Cardiovascular: Normal rate and regular rhythm.   Pulmonary/Chest: Effort normal and breath sounds normal.  Abdominal: Soft. Bowel sounds are normal.  Musculoskeletal:  Lower back examined. No evidence of an abscess or cellulitis  Neurological: She is alert and oriented to person, place, and time.  Skin: Skin is warm and dry.  Psychiatric: She has a normal mood and affect. Her behavior is normal.  Nursing note and vitals reviewed.   ED Course  Procedures (including critical care time) Labs Review Labs Reviewed  CBC - Abnormal; Notable for the following:    Hemoglobin 11.4 (*)    HCT 35.4 (*)    All other components within normal limits  BASIC METABOLIC PANEL - Abnormal; Notable for the following:    Glucose, Bld 103 (*)    BUN 26 (*)    GFR calc non Af Amer 54 (*)    All other components within normal limits  URINALYSIS, ROUTINE W REFLEX MICROSCOPIC (NOT AT [facility]) - Abnormal; Notable for the following:    APPearance CLOUDY (*)    Leukocytes, UA SMALL (*)    All other components within normal limits  URINE MICROSCOPIC-ADD ON - Abnormal; Notable for the following:    Squamous Epithelial / LPF FEW (*)    All other components within normal limits  I-STAT CHEM 8, ED - Abnormal; Notable for the following:    BUN 29 (*)    Glucose, Bld 101 (*)    All other components within normal limits  URINE CULTURE  CBG MONITORING, ED    Imaging Review Dg Chest 2 View  07/09/2014   CLINICAL DATA:  Shortness of breath and weakness for several days. History of COPD, autoimmune hepatitis and smoking. Initial encounter.  EXAM: CHEST  2 VIEW  COMPARISON:  Radiographs and CT 09/23/2010.  FINDINGS: The heart size and  mediastinal contours are stable. There is a moderate size hiatal hernia. The lungs are hyperinflated with mild bibasilar atelectasis. No edema, confluent airspace opacity or significant pleural effusion demonstrated. The bones are diffusely demineralized. No definite acute osseous findings seen.  IMPRESSION: Stable cardiomegaly, hiatal hernia and chronic obstructive lung disease. No acute chest findings demonstrated.   Electronically Signed   By: Carey Bullocks M.D.   On: 07/09/2014 10:27   Ct Head Wo Contrast  07/09/2014   CLINICAL DATA:  Nausea, weakness and fatigue since spinal steroid injection 4 weeks ago.  EXAM: CT HEAD WITHOUT CONTRAST  TECHNIQUE: Contiguous axial images were obtained from the base of the skull through the vertex without intravenous contrast.  COMPARISON:  09/20/2010  FINDINGS: The brain shows mild generalized atrophy. There is atherosclerotic calcification of the major vessels at the base of the brain. There is questionable low density in the left cerebellum. This could simply be secondary to streak artifact from the skullbase, but a cerebellar infarction is not completely excluded. No focal finding in the cerebral hemispheres. No mass lesion, hemorrhage, hydrocephalus or extra-axial collection. No calvarial abnormality. Sinuses, middle ears and mastoids are clear.  IMPRESSION: No definite acute finding. Questionable low-density in the left cerebellum. I favor that this represents beam attenuation from dense bone at the skullbase, but a left cerebellar infarction is not completely excluded. If this is a clinical possibility, MRI could make that distinction. 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   Electronically Signed   By: Paulina Fusi M.D.   On: 07/09/2014 10:39   Mr Brain Wo Contrast  07/09/2014   CLINICAL DATA:  Acute onset of weakness. Possible cerebellar abnormality at CT.  EXAM: MRI HEAD WITHOUT CONTRAST  TECHNIQUE: Multiplanar,  multiecho pulse sequences of the brain and surrounding structures were obtained without intravenous contrast.  COMPARISON:  CT same day.  FINDINGS: Diffusion imaging does not show any acute or subacute infarction. There is no cerebellar infarction, the CT findings being secondary to beam hardening artifact. The brainstem and cerebellum are normal. There are minimal chronic small-vessel changes of the hemispheric white matter, less than often seen in healthy individuals of this age. No mass lesion, hemorrhage, hydrocephalus or extra-axial collection. No pituitary mass. No inflammatory sinus disease. Small amount of fluid in the mastoid air cells on the left. No skull or skullbase lesion.  IMPRESSION: No acute finding. Mild age related volume loss and minimal small vessel change of the white matter. No cerebellar stroke.   Electronically Signed   By: Paulina Fusi M.D.   On: 07/09/2014 14:05     EKG Interpretation   Date/Time:  Sunday July 09 2014 08:53:30 EDT Ventricular Rate:  73 PR Interval:    QRS Duration: 146 QT Interval:  422 QTC Calculation: 465 R Axis:   -68 Text Interpretation:  Atrial fibrillation RBBB and LAFB Probable left  ventricular hypertrophy Confirmed by Nitza Schmid  MD, Atalaya Zappia ([id number]) on 07/09/2014  10:19:36 AM      MDM   Final diagnoses:  Weakness   Patient is in no acute distress. CT scan of head shows questionable density in the left cerebellum. MRI of brain which  showed no acute findings.  Urinalysis shows minimal evidence of infection. Will increase cephalexin 250 mg from qd to TID. Discussed all findings with patient and her family. We discussed all her options. She will return to her assisted living facility in [place] for follow-up.  I discussed the possibility of epidural abscess with the patient and her daughter and grandchildren. There is absolutely no tenderness or erythema in the lower paraspinous area. I would suspect patient to be symptomatic with pain in that area  if this scenario was developing. Patient will get follow-up in [place] on Monday. Patient and family understand to return for worsening symptoms.    Donnetta Hutching, MD 07/09/14 1534  Donnetta Hutching, MD 07/10/14 1744  Donnetta Hutching, MD 07/18/14 2100

## 2014-07-09 NOTE — ED Notes (Addendum)
Per EMS: Called out for shortness of breath and extreme weakness.  Weakness has been progressed since spinal injection.  Noticeable yesterday, more so today, too weak to get out of bed.  States she feels weak.  160/80, CBG 100.  RBBB 12 lead otherwise unremarkable.  89% RA with FD.  2 liters 94%.  IV left hand 22 gauge.  History:  thyroid hx, light copd.  Orthostatic hypotension, spinal injury 4 weeks ago that she took steroid injection for.  Has not eaten since last night.  Here pretty much for generalized weakness, most noticeable past 2 days.  Has hx of autoimmune hepatitis, controlled by meds.

## 2014-07-09 NOTE — ED Notes (Signed)
Patient transported to MRI 

## 2014-07-10 LAB — URINE CULTURE: Colony Count: 7000

## 2016-10-14 ENCOUNTER — Inpatient Hospital Stay
Admission: RE | Admit: 2016-10-14 | Discharge: 2016-12-27 | Disposition: A | Discharge status: Hospice | Payer: Medicare Other | Source: Ambulatory Visit | Attending: Internal Medicine | Admitting: Internal Medicine

## 2016-10-16 ENCOUNTER — Encounter: Payer: Self-pay | Admitting: Internal Medicine

## 2016-10-16 ENCOUNTER — Non-Acute Institutional Stay (SKILLED_NURSING_FACILITY): Payer: Medicare Other | Admitting: Internal Medicine

## 2016-10-16 DIAGNOSIS — G20 Parkinson's disease: Secondary | ICD-10-CM | POA: Diagnosis not present

## 2016-10-16 DIAGNOSIS — Z515 Encounter for palliative care: Secondary | ICD-10-CM | POA: Diagnosis not present

## 2016-10-16 DIAGNOSIS — I951 Orthostatic hypotension: Secondary | ICD-10-CM

## 2016-10-16 NOTE — Progress Notes (Signed)
Provider:Tamra Koos,Maddix Heinz   Location:   [facility] Room Number: 133/P Place of Service:  SNF (31)  PCP: Benita Stabile, MD Patient Care Team: Benita Stabile, MD as PCP - General (Internal Medicine)  Extended Emergency Contact Information Primary Emergency Contact: Clint Lipps Address: [address]          [place], [place] Home Phone: [phone] Work Phone: [phone] Mobile Phone: [phone] Relation: Brother Secondary Emergency Contact: Causey,Marcine Address: [address]          [place], [place] [address] [place] Home Phone: [phone] Relation: Daughter  Code Status: DNR Goals of Care: Advanced Directive information Advanced Directives 10/16/2016  Does Patient Have a Medical Advance Directive? Yes  Type of Advance Directive Out of facility DNR (pink MOST or yellow form)  Does patient want to make changes to medical advance directive? No - Patient declined  Would patient like information on creating a medical advance directive? -  Pre-existing out of facility DNR order (yellow form or pink MOST form) -      Chief Complaint  Patient presents with  . New Admit To SNF    New Admission Visit    HPI: Patient is a 83 y.o. female seen today for admission to SNF for hospice care  Patient has h/o parkinson disease, recurrent UTI, Syncope, orthostatic Hypotension, recurrent falls, CKD, GERD, hypertension and Hypothyroid. She was in ALF in June but since July she started having recurrent falls, low BP decreased appetite and Loosing weight . Per her daughter she has lost almost 50 lbs in past few months.She was in Hospice care in [place] in July and then was transferred to [place] hospice home to be close to her family and friends. And now is transferred to SNF for Long term care under hospice. Patient main complain are Weakness. She denies being in pain.  She says she has no appetite. No nausea or  vomiting or abdominal Pain. She does c/o Mild SOB but no chest pain or Cough.  Past Medical History:  Diagnosis Date  . GERD (gastroesophageal reflux disease)   . Hypotension   . Thyroid disease    Past Surgical History:  Procedure Laterality Date  . ABDOMINAL HYSTERECTOMY    . HIP SURGERY  10/23/09   left hip partial replacement, kept socket and had ball and shaft replaced    reports that she has quit smoking. She has never used smokeless tobacco. She reports that she does not drink alcohol or use drugs. Social History   Social History  . Marital status: Widowed    Spouse name: N/A  . Number of children: N/A  . Years of education: N/A   Occupational History  . Not on file.   Social History Main Topics  . Smoking status: Former Games developer  . Smokeless tobacco: Never Used  . Alcohol use No  . Drug use: No  . Sexual activity: No   Other Topics Concern  . Not on file   Social History Narrative  . No narrative on file    Functional Status Survey:    History reviewed. No pertinent family history.  Health Maintenance  Topic Date Due  . INFLUENZA VACCINE  12/27/2016 (Originally 08/27/2016)  . DEXA SCAN  12/27/2016 (Originally 07/19/1991)  . TETANUS/TDAP  12/27/2016 (Originally 07/18/1943)  . PNA vac Low Risk Adult (2 of 2 - PCV13) 12/27/2016 (Originally 10/28/2006)    No Known Allergies  Outpatient Encounter Prescriptions  as of 10/16/2016  Medication Sig  . Balsam Peru-Castor Oil (VENELEX) OINT Apply topically. Apply to sacrum and bilateral buttocks q shift every shift  . carbidopa-levodopa (SINEMET IR) 25-100 MG tablet Take 2 tablets by mouth 2 (two) times daily. Give 1 tablet by mouth at bedtime  . haloperidol (HALDOL) 2 MG tablet Take 2 mg by mouth daily. May have additional 2 mg by mouth every 4 hours prn for anxiety and agitation  . ibuprofen (ADVIL,MOTRIN) 200 MG tablet Take 400 mg by mouth 4 (four) times daily.  Marland Kitchen morphine 20 MG/5ML solution Take 4 mg by mouth every  2 (two) hours as needed for pain.  Marland Kitchen senna (SENOKOT) 8.6 MG TABS tablet Take 1 tablet by mouth 4 (four) times daily.  . [DISCONTINUED] alendronate (FOSAMAX) 70 MG tablet Take 70 mg by mouth once a week. Thursday  . [DISCONTINUED] aspirin EC 81 MG tablet Take 81 mg by mouth daily.    . [DISCONTINUED] calcium-vitamin D (OSCAL WITH D) 500-200 MG-UNIT per tablet Take 1 tablet by mouth daily.    . [DISCONTINUED] cephALEXin (KEFLEX) 250 MG capsule Take 1 capsule (250 mg total) by mouth 3 (three) times daily.  . [DISCONTINUED] citalopram (CELEXA) 20 MG tablet Take 20 mg by mouth daily.  . [DISCONTINUED] Cranberry 200 MG CAPS Take 400 mg by mouth daily.  . [DISCONTINUED] cyanocobalamin (,VITAMIN B-12,) 1000 MCG/ML injection Inject 1 mL (1,000 mcg total) into the muscle once. (Patient taking differently: Inject 1,000 mcg into the muscle once. Every 15th of each month)  . [DISCONTINUED] furosemide (LASIX) 20 MG tablet Take 20 mg by mouth daily.  . [DISCONTINUED] gabapentin (NEURONTIN) 100 MG capsule Take 100 mg by mouth 2 (two) times daily.  . [DISCONTINUED] ibuprofen (ADVIL,MOTRIN) 800 MG tablet Take 400 mg by mouth 4 (four) times daily.   . [DISCONTINUED] levothyroxine (SYNTHROID, LEVOTHROID) 100 MCG tablet Take 100 mcg by mouth daily.  . [DISCONTINUED] meloxicam (MOBIC) 7.5 MG tablet Take 7.5 mg by mouth daily.  . [DISCONTINUED] mycophenolate (CELLCEPT) 250 MG capsule Take 250 mg by mouth 2 (two) times daily.  . [DISCONTINUED] omeprazole (PRILOSEC) 20 MG capsule Take 20 mg by mouth daily.  . [DISCONTINUED] polyethylene glycol (MIRALAX / GLYCOLAX) packet Take 17 g by mouth daily as needed. Constipation   . [DISCONTINUED] potassium chloride SA (K-DUR,KLOR-CON) 20 MEQ tablet Take 20 mEq by mouth daily.  . [DISCONTINUED] spironolactone (ALDACTONE) 25 MG tablet Take 1 tablet (25 mg total) by mouth daily.  . [DISCONTINUED] traMADol (ULTRAM) 50 MG tablet Take 50 mg by mouth every 6 (six) hours as needed. For  pain  . [DISCONTINUED] vitamin C (ASCORBIC ACID) 500 MG tablet Take 500 mg by mouth daily.   No facility-administered encounter medications on file as of 10/16/2016.      Review of Systems  Constitutional: Positive for activity change, appetite change, fatigue and unexpected weight change. Negative for chills and fever.  HENT: Negative.   Respiratory: Positive for shortness of breath. Negative for apnea, cough and wheezing.   Cardiovascular: Negative.   Gastrointestinal: Negative.   Genitourinary: Negative.   Musculoskeletal: Negative.   Skin: Negative.   Neurological: Positive for dizziness and weakness.  Psychiatric/Behavioral: Positive for agitation and confusion. The patient is nervous/anxious.     Vitals:   10/16/16 1039  BP: (!) 87/51  Pulse: 68  Resp: 20  Temp: (!) 97.4 F (36.3 C)  TempSrc: Oral   There is no height or weight on file to calculate BMI. Physical Exam  Constitutional: She appears well-developed and well-nourished.  HENT:  Head: Normocephalic.  Mouth/Throat: Mucous membranes are dry.  Eyes: Pupils are equal, round, and reactive to light.  Neck: Neck supple.  Cardiovascular: Normal rate and normal heart sounds.   No murmur heard. Pulmonary/Chest: Effort normal and breath sounds normal. No respiratory distress. She has no wheezes. She has no rales.  Abdominal: Soft. Bowel sounds are normal. She exhibits no distension. There is no tenderness. There is no rebound.  Musculoskeletal: She exhibits no edema.  Neurological: She is alert.  Thinks she is [place]. No focal deficits except doesn't move her Right hand that well  Skin: Skin is warm and dry.  Psychiatric: She has a normal mood and affect. Her speech is delayed. She is slowed.    Labs reviewed: Basic Metabolic Panel: No results for input(s): NA, K, CL, CO2, GLUCOSE, BUN, CREATININE, CALCIUM, MG, PHOS in the last 8760 hours. Liver Function Tests: No results for input(s): AST, ALT, ALKPHOS,  BILITOT, PROT, ALBUMIN in the last 8760 hours. No results for input(s): LIPASE, AMYLASE in the last 8760 hours. No results for input(s): AMMONIA in the last 8760 hours. CBC: No results for input(s): WBC, NEUTROABS, HGB, HCT, MCV, PLT in the last 8760 hours. Cardiac Enzymes: No results for input(s): CKTOTAL, CKMB, CKMBINDEX, TROPONINI in the last 8760 hours. BNP: Invalid input(s): POCBNP No results found for: HGBA1C  Lab Results  Component Value Date   VITAMINB12 673 10/22/2009   Lab Results  Component Value Date   FOLATE  10/22/2009    16.6 (NOTE)  Reference Ranges        Deficient:       0.4 - 3.3 ng/mL        Indeterminate:   3.4 - 5.4 ng/mL        Normal:              > 5.4 ng/mL   Lab Results  Component Value Date   IRON 22 (L) 10/22/2009   TIBC 271 10/22/2009   FERRITIN 37 10/22/2009    Imaging and Procedures obtained prior to SNF admission: No results found.  Assessment/Plan  End of life care in patient with multiple comorbidity  D/w the patient and her daughter. Daughter was little concern that her Mom is not eating that much but she mostly understands that she is at end of her life. Patient is comfortable and does not look in any distress. According to daughter she was on PRN Haldol but she will get agitated and try to get out of bed. And she has been more comfortable on standing dose. Her Pian is controlled on Morphine PRN. Will continue to follow patient with hospice.    Total time spent in this patient care encounter was 45_ minutes; greater than 50% of the visit spent counseling patient and end of life d/w the daughter., reviewing records ,  and coordinating care for problems addressed at this encounter.   Family/ staff Communication:   Labs/tests ordered:

## 2016-11-05 ENCOUNTER — Non-Acute Institutional Stay (SKILLED_NURSING_FACILITY): Payer: Medicare Other | Admitting: Internal Medicine

## 2016-11-05 ENCOUNTER — Encounter: Payer: Self-pay | Admitting: Internal Medicine

## 2016-11-05 DIAGNOSIS — R600 Localized edema: Secondary | ICD-10-CM

## 2016-11-05 NOTE — Progress Notes (Signed)
Location:   [facility] Room Number: 133/P Place of Service:  SNF [phone]) Provider:  Loraine Leriche, MD  Patient Care Team: Benita Stabile, MD as PCP - General (Internal Medicine)  Extended Emergency Contact Information Primary Emergency Contact: Clint Lipps Address: [address]          [place], [place] Home Phone: [phone] Work Phone: [phone] Mobile Phone: [phone] Relation: Brother Secondary Emergency Contact: Causey,Marcine Address: [address]          [place], [place] [address] [place] Home Phone: [phone] Relation: Daughter  Code Status:  DNR/Hospice Goals of care: Advanced Directive information Advanced Directives 11/05/2016  Does Patient Have a Medical Advance Directive? Yes  Type of Advance Directive Out of facility DNR (pink MOST or yellow form)  Does patient want to make changes to medical advance directive? No - Patient declined  Would patient like information on creating a medical advance directive? -  Pre-existing out of facility DNR order (yellow form or pink MOST form) -     Chief Complaint  Patient presents with  . Acute Visit    Edema in hand    HPI:  Pt is a 83 y.o. female seen today for an acute visit for Follow-up right hand edema.  Patient is a recent admission to the facility she is under hospice services.  She has a history of Parkinson's disease recurrent UTIs syncope orthostatic hypotension falls chronic kidney disease GERD hypertension and hypothyroidism.  She had been in assisted living but had increased falls with decreased blood pressure losing weight with poor appetite.  She had been in hospice care in the [place] area but was transferred to [place] hospice home to be closer to her family and friends eventually she's been transferred now to skilled nursing for long-term care under hospice.  Her stay here apparently has been fairly  unremarkable she is followed closely by hospice but she did develop some right hand edema and I am following up on this.  Per her daughter initially in the morning she appears to have more pain with the edema but once the hand is moved some it appears actually to get better.  There's been no apparent history of trauma falls etc. which may have caused this  She is on when necessary morphine every 2 hours as well as when necessary ibuprofen.  At this point she is resting in bed comfortably does not appear to be having significant pain her daughter is at bedside       Past Medical History:  Diagnosis Date  . GERD (gastroesophageal reflux disease)   . Hypotension   . Thyroid disease    Past Surgical History:  Procedure Laterality Date  . ABDOMINAL HYSTERECTOMY    . HIP SURGERY  10/23/09   left hip partial replacement, kept socket and had ball and shaft replaced    No Known Allergies  Outpatient Encounter Prescriptions as of 11/05/2016  Medication Sig  . Balsam [place]-Castor Oil (VENELEX) OINT Apply topically. Apply to sacrum and bilateral buttocks q shift every shift  . carbidopa-levodopa (SINEMET IR) 25-100 MG tablet Take 2 tablets by mouth 2 (two) times daily. Give 1 tablet by mouth at bedtime  . haloperidol (HALDOL) 2 MG/ML solution Give 1 ml by mouth once an evening  . ibuprofen (ADVIL,MOTRIN) 200 MG tablet Take 400 mg by mouth 4 (four) times daily.  Marland Kitchen morphine 20 MG/5ML solution Take  4 mg by mouth every 2 (two) hours as needed for pain.  Marland Kitchen senna (SENOKOT) 8.6 MG TABS tablet Take 1 tablet by mouth 4 (four) times daily.  . sertraline (ZOLOFT) 50 MG tablet Take 50 mg by mouth daily.  . [DISCONTINUED] haloperidol (HALDOL) 2 MG tablet Take 2 mg by mouth daily. May have additional 2 mg by mouth every 4 hours prn for anxiety and agitation   No facility-administered encounter medications on file as of 11/05/2016.     Review of Systems  Essentially unattainable secondary to patient  not really speaking but per daughter she appears to have her pain controlled continues with a poor appetite-apparently has had some history of agitation and confusion and has Haldol in the evening which apparently is helping.     Immunization History  Administered Date(s) Administered  . Pneumococcal Polysaccharide-23 10/27/2005   Pertinent  Health Maintenance Due  Topic Date Due  . INFLUENZA VACCINE  12/27/2016 (Originally 08/27/2016)  . DEXA SCAN  12/27/2016 (Originally 07/19/1991)  . PNA vac Low Risk Adult (2 of 2 - PCV13) 12/27/2016 (Originally 10/28/2006)   No flowsheet data found. Functional Status Survey:    Vitals:   11/05/16 1040  BP: (!) 81/53  Pulse: 67  Resp: 20  Temp: 97.8 F (36.6 C)  TempSrc: Oral    Physical Exam   Gen. this is a frail elderly female who does not appear to be in any distress she is resting comfortably in bed is not speaking much today.  Her skin is warm and dry she does have a small skin tear right wrist area with Steri-Strips in place I do not see any sign of infection of erythema warmth or drainage around the site.  Eyes she has prescription lenses for so daily. Grossly intact  Chest is clear to auscultation with poor respiratory effort  Heart is regular rate and rhythm without murmur gallop or rub she does not have significant lower extremity edema.  Abdomen is soft with active bowel sounds does not appear to be tender.  Musculoskeletal has general frailty on right hand there is edema that is cool to touch and non-erythematous does not appear to be tender to palpati  At  this point --with movement of the right wrist and hand she does not appear to have significant pain although apparently earlier in the day she does-- this apparently gets better once the hand is moved some.  Neurologic she is alert but not speaking   Psych she is essentially nonverbal today apparently has been speaking less and less    .    Labs reviewed: No  results for input(s): NA, K, CL, CO2, GLUCOSE, BUN, CREATININE, CALCIUM, MG, PHOS in the last 8760 hours. No results for input(s): AST, ALT, ALKPHOS, BILITOT, PROT, ALBUMIN in the last 8760 hours. No results for input(s): WBC, NEUTROABS, HGB, HCT, MCV, PLT in the last 8760 hours.  No results found for: HGBA1C No results found for: CHOL, HDL, LDLCALC, LDLDIRECT, TRIG, CHOLHDL  Significant Diagnostic Results in last 30 days:  No results found.  Assessment and plan #1 and edema-at this point does not appear to be infectious-suspect there may be some dependency related issues here her hand is currently elevated this will need to be encouraged and will monitor for any changes at this point pain appears to be controlled-- Her daughter was wondering about possibly physical therapy although this will be difficult with patient being under hospice I did speak with the therapist to suggested  possible pad device as the call light that would make it easier for patient to summon help if needed-at this point is difficult for her to push the call light-  Have spoken with nursing who will try to address this.  [id number] note greater than 25 minutes spent assessing patient-reviewing her chart-discussing her status with her daughter at bedside-as well as with nursing-and coordinating plan of care as noted above

## 2016-11-17 ENCOUNTER — Encounter: Payer: Self-pay | Admitting: Internal Medicine

## 2016-11-17 ENCOUNTER — Non-Acute Institutional Stay (SKILLED_NURSING_FACILITY): Payer: Medicare Other | Admitting: Internal Medicine

## 2016-11-17 DIAGNOSIS — R609 Edema, unspecified: Secondary | ICD-10-CM | POA: Diagnosis not present

## 2016-11-17 DIAGNOSIS — I951 Orthostatic hypotension: Secondary | ICD-10-CM | POA: Diagnosis not present

## 2016-11-17 DIAGNOSIS — G20 Parkinson's disease: Secondary | ICD-10-CM

## 2016-11-17 DIAGNOSIS — G20A1 Parkinson's disease without dyskinesia, without mention of fluctuations: Secondary | ICD-10-CM

## 2016-11-17 NOTE — Progress Notes (Signed)
Location:   [facility] Room Number: 133/P Place of Service:  SNF [phone]) Provider:  Loraine Leriche, MD  Patient Care Team: Benita Stabile, MD as PCP - General (Internal Medicine)  Extended Emergency Contact Information Primary Emergency Contact: Clint Lipps Address: [address]          [place], [place] Home Phone: [phone] Work Phone: [phone] Mobile Phone: [phone] Relation: Brother Secondary Emergency Contact: Causey,Marcine Address: [address]          [place], [place] [address] [place] Home Phone: [phone] Relation: Daughter  Code Status:  DNR/ Hospice Goals of care: Advanced Directive information Advanced Directives 11/17/2016  Does Patient Have a Medical Advance Directive? Yes  Type of Advance Directive Out of facility DNR (pink MOST or yellow form)  Does patient want to make changes to medical advance directive? No - Patient declined  Would patient like information on creating a medical advance directive? -  Pre-existing out of facility DNR order (yellow form or pink MOST form) -      Chief complaint-acute visit secondary to tremors  HPI:  Pt is a 83 y.o. female seen today for an acute visit for follow-up of apparent increased tremors.  Patient is under hospice care-she does have a history of Parkinson's disease previously had been on Sinemet but this was discontinued secondary to her end-stage prognosis.  She also has a previous history of orthostatic hypotension recurrent falls chronic kidney disease GERD hypertension hypothyroidism.  She is now on long-term care under hospice services.  Her stay here has been relatively uncomplicated she did develop some right hand edema of unknown etiology this was thought possibly to be more dependent related this is somewhat persistent and nursing staff is trying to have an elevated.  Her daughter has noticed some increased  tremors that are intermittent or so it appears of the upper extremities and she was wondering about restarting Sinemet.  Patient was also thought to have some depressive symptoms and her Zoloft has been restarted prior to starting an earlier this month she had been on and then discontinued apparently at some point in the past.  Currently she is sitting in her chair comfortably she is actually more bright and alert than when I saw her last time continues to have the right hand edema noted at times some intermittent tremor more so of her right upper extremity  She continues to have some what low blood pressures systolic was in the 90s today but this does not appear to be totally unusual systolics appear to be largely around 100-she does have a long-standing history of orthostatic hypotension-I suspect her immobility has probably contributed to this as well     Past Medical History:  Diagnosis Date  . GERD (gastroesophageal reflux disease)   . Hypotension   . Thyroid disease    Past Surgical History:  Procedure Laterality Date  . ABDOMINAL HYSTERECTOMY    . HIP SURGERY  10/23/09   left hip partial replacement, kept socket and had ball and shaft replaced    No Known Allergies  Outpatient Encounter Prescriptions as of 11/17/2016  Medication Sig  . Balsam Peru-Castor Oil (VENELEX) OINT Apply topically. Apply to sacrum and bilateral buttocks q shift every shift  . haloperidol (HALDOL) 2 MG/ML solution Give 1 ml by mouth once an evening  . ibuprofen (ADVIL,MOTRIN) 200 MG tablet Take 400 mg by mouth 4 (four) times  daily.  . morphine 20 MG/5ML solution Take 4 mg by mouth every 2 (two) hours as needed for pain.  Marland Kitchen senna (SENOKOT) 8.6 MG TABS tablet Take 1 tablet by mouth 4 (four) times daily.  . sertraline (ZOLOFT) 50 MG tablet Take 50 mg by mouth daily.  . [DISCONTINUED] carbidopa-levodopa (SINEMET IR) 25-100 MG tablet Take 2 tablets by mouth 2 (two) times daily. Give 1 tablet by mouth at  bedtime   No facility-administered encounter medications on file as of 11/17/2016.     Review of Systems   Mrs. limited since patient does not speak much provided by nursing and daughter as well.  In general there been really no significant pain complaints to my knowledge.  Skin no diaphoresis itching or rash.  Head ears eyes nose mouth and throat does not complain of visual changes or sore throat.  Respiratory no complaints of shortness of breath or cough.  Cardiac no chest pain.  GI no complaints of abdominal discomfort nausea vomiting diarrhea constipation.  Muscle skeletal again pain apparently has not been an issue here.  Neurologic again her daughter has noted some mildly increased intermittent tremors-she does have a history of heart disease.  Psych she appears to be more alert and bright today than when I saw her previously  Immunization History  Administered Date(s) Administered  . Pneumococcal Polysaccharide-23 10/27/2005   Pertinent  Health Maintenance Due  Topic Date Due  . INFLUENZA VACCINE  12/27/2016 (Originally 08/27/2016)  . DEXA SCAN  12/27/2016 (Originally 07/19/1991)  . PNA vac Low Risk Adult (2 of 2 - PCV13) 12/27/2016 (Originally 10/28/2006)   No flowsheet data found. Functional Status Survey:     She is afebrile pulse of 60 respirations of 16 blood pressure 91/56 Physical Exam   In general this is a frail elderly female who appears to be comfortable sitting in her chair she is bright alert making I contacted speaking some today.  Her skin is warm and dry she continues to have a small skin tear with Steri-Strips right lower arm and has some edema of the hand this is cool to touch non-erythematous does not appear to be tender.  Eyes has prescription lenses visual acuity appears grossly intact.  Oropharynx is clear mucous membranes moist.  Chest is clear to auscultation there is no labored breathing.  Heart is regular rate and rhythm without  murmur gallop or rub she does not have significant lower extremity edema.  Her abdomen is soft does not appear to be tender has positive bowel sounds.  Musculoskeletal has general frailty again has the right hand edema as noted above.  Is able to move all extremities again has significant weakness however does not really move her arms much in response to verbal commands.  Neurologic did note a mild intermittent tremor of her right arm intermittentl--she is alert today making eye contact speaking some  Psych she is pleasant smiling does speak some today.    Labs reviewed: No results for input(s): NA, K, CL, CO2, GLUCOSE, BUN, CREATININE, CALCIUM, MG, PHOS in the last 8760 hours. No results for input(s): AST, ALT, ALKPHOS, BILITOT, PROT, ALBUMIN in the last 8760 hours. No results for input(s): WBC, NEUTROABS, HGB, HCT, MCV, PLT in the last 8760 hours.  No results found for: HGBA1C No results found for: CHOL, HDL, LDLCALC, LDLDIRECT, TRIG, CHOLHDL  Significant Diagnostic Results in last 30 days:  No results found.  Assessment/Plan  #1 tremors with history of Parkinson's disease per chart review she  had been on low-dose Sinemet at night-will restart this will start this at one tablet daily at bedtime and monitor.  #2-history of right hand edema again have encouraged  elevation her daughter continues to be concerned that she would benefit from some therapy -- she now does have a call pads instead of a button oh to push if needed-will readdress this again with staff to see if there could be additional measures   #3 hypotension again this is not a new situation she has Parkinson's disease with relative immobility she is not on any antihypertensives at this point will monitor clinically she appears stable actually improved from when I saw her previously  [id number]--of note greater than 25 minutes spent assessing patient-discussing her status with nursing staff as well as with her daughter  at bedside-ands coordinating and formulating a plan of care-of note greater than 50% of time spent coordinating plan of care with input as noted above

## 2016-11-19 ENCOUNTER — Non-Acute Institutional Stay (SKILLED_NURSING_FACILITY): Payer: Medicare Other | Admitting: Internal Medicine

## 2016-11-19 ENCOUNTER — Encounter: Payer: Self-pay | Admitting: Internal Medicine

## 2016-11-19 DIAGNOSIS — R600 Localized edema: Secondary | ICD-10-CM

## 2016-11-19 DIAGNOSIS — G20 Parkinson's disease: Secondary | ICD-10-CM

## 2016-11-19 DIAGNOSIS — R195 Other fecal abnormalities: Secondary | ICD-10-CM | POA: Diagnosis not present

## 2016-11-19 NOTE — Progress Notes (Signed)
Location:   [facility] Room Number: 133/P Place of Service:  SNF [phone]) Provider:  Loraine Leriche, MD  Patient Care Team: Benita Stabile, MD as PCP - General (Internal Medicine)  Extended Emergency Contact Information Primary Emergency Contact: Clint Lipps Address: [address]          [place], [place] Home Phone: [phone] Work Phone: [phone] Mobile Phone: [phone] Relation: Brother Secondary Emergency Contact: Causey,Marcine Address: [address]          [place], [place] [address] [place] Home Phone: [phone] Relation: Daughter  Code Status:  DNR/Hospice Goals of care: Advanced Directive information Advanced Directives 11/19/2016  Does Patient Have a Medical Advance Directive? Yes  Type of Advance Directive Out of facility DNR (pink MOST or yellow form)  Does patient want to make changes to medical advance directive? No - Patient declined  Would patient like information on creating a medical advance directive? -  Pre-existing out of facility DNR order (yellow form or pink MOST form) -     Chief Complaint  Patient presents with  . Acute Visit    Swollen Right Hand    HPI:  Pt is a 83 y.o. female seen today for an acute visit for Follow-up of an edematous right hand.  She has had a recent history of this fall to be dependent related and it comes and goes apparently but has increased somewhat today.  Patient is under hospice care with a history of Parkinson's disease as well as a history of orthostatic hypotension recurrent falls chronic kidney disease GERD hypertension hypothyroidism.  Recently daughter became concerned about some possible increased tremors and she's been started on low-dose Sinemet at night and apparently this is helping.  However the hand appears to be somewhat more edematous today again and we are following up on this.  Her daughter states the hand  appear to have some pain this morning--when it was palpated but appears to be somewhat more comfortable later in the day.  Patient is a poor historian and does not really speak much she does make eye contact smiles and speaks minimally there is been no history of trauma to the hand.     Past Medical History:  Diagnosis Date  . GERD (gastroesophageal reflux disease)   . Hypotension   . Thyroid disease    Past Surgical History:  Procedure Laterality Date  . ABDOMINAL HYSTERECTOMY    . HIP SURGERY  10/23/09   left hip partial replacement, kept socket and had ball and shaft replaced    No Known Allergies  Outpatient Encounter Prescriptions as of 11/19/2016  Medication Sig  . Balsam [place]-Castor Oil (VENELEX) OINT Apply topically. Apply to sacrum and bilateral buttocks q shift every shift  . carbidopa-levodopa (SINEMET IR) 25-100 MG tablet Take 1 tablet by mouth at bedtime.  . haloperidol (HALDOL) 2 MG/ML solution Give 1 ml by mouth once an evening  . ibuprofen (ADVIL,MOTRIN) 200 MG tablet Take 400 mg by mouth 4 (four) times daily.  Marland Kitchen morphine 20 MG/5ML solution Take 4 mg by mouth every 2 (two) hours as needed for pain.  Marland Kitchen senna (SENOKOT) 8.6 MG TABS tablet Take 1 tablet by mouth 4 (four) times daily.  . sertraline (ZOLOFT) 50 MG tablet Take 50 mg by mouth daily.   No facility-administered encounter medications on file as of 11/19/2016.     Review of Systems Limited secondary to  patient not speaking much provided by nursing and daughter.  Gen. then no complaints of significant pain at this point.  Skin does not complain of diaphoresis or itching.  Head ears eyes nose mouth and throat no significant changes per daughter.  Respiratory does not complain of shortness of breath or cough.  Cardiac no complaints of chest pain.  GI no abdominal pain but daughter has noted some diarrhea which appears to be somewhat watery nonodorous.  Musculoskeletal again joint pain at this point  appears controlled she has general frailty has had some edema of her right hand which increases at times.  Neurologic again history of Parkinson's with tremors apparently these tremors have improved somewhat with the addition of Sinemet.  Psych she continues to be alert right did make eye contact pleasant smiling-no behaviors have been noted Immunization History  Administered Date(s) Administered  . Pneumococcal Polysaccharide-23 10/27/2005   Pertinent  Health Maintenance Due  Topic Date Due  . INFLUENZA VACCINE  12/27/2016 (Originally 08/27/2016)  . DEXA SCAN  12/27/2016 (Originally 07/19/1991)  . PNA vac Low Risk Adult (2 of 2 - PCV13) 12/27/2016 (Originally 10/28/2006)   No flowsheet data found. Functional Status Survey:    Vitals:   11/19/16 1138  BP: (!) 98/57  Pulse: 66  Resp: 20  Temp: 97.9 F (36.6 C)  TempSrc: Oral    Physical Exam   In general this is a frail elderly female in no distress she is resting comfortably in bed she is alert makes eye contact pleasant smiling speaks minimally.  Her skin is warm and dry-.  Eyes sclera and conjunctiva are clear visual acuity appears grossly intact.  Chest is clear to auscultation with shallow air entry and poor respiratory effort.  Heart is regular rate and rhythm without murmur gallop rub she does not have significant lower extremity edema.  Abdomen is soft nontender with positive bowel sounds.  Muscle skeletal has general frailty does have some increased edema of her right hand fingers-this does not appear to be acutely tender to touch it is cool does not give an  erythematous cellulitic presentation  This is  somewhat increased compared to previous exam--radial pulse is palpable but somewhat reduced  Neurologic she is alert and did not note any tremors today-.  Psych she is pleasant smiling makes eye contact speaks a small amount  Labs reviewed: No results for input(s): NA, K, CL, CO2, GLUCOSE, BUN, CREATININE,  CALCIUM, MG, PHOS in the last 8760 hours. No results for input(s): AST, ALT, ALKPHOS, BILITOT, PROT, ALBUMIN in the last 8760 hours. No results for input(s): WBC, NEUTROABS, HGB, HCT, MCV, PLT in the last 8760 hours.  No results found for: HGBA1C No results found for: CHOL, HDL, LDLCALC, LDLDIRECT, TRIG, CHOLHDL  Significant Diagnostic Results in last 30 days:  No results found.  Assessment/Plan   #1-right hand edema as-this appears to increase and decrease at times but is increased today-at this point we'll order an x-ray we have been trying to be as noninvasive as possible with patient's status-but daughter is agreeable to obtaining an x-ray which can be done by mobile services.  Also will obtain a venous Doppler to rule out any DVT although I suspect the likelihood is low would like to rule this out.  #2 diarrhea disc does not really give a C. difficile presentation but will write an order to C. difficile any loose stools to make sure and continue to monitor.  #3-Parkinson's disease again Sinemet has been restarted will  monitor tremors apparently have improved   Of note her daughter will be out of town for a period of time other family members will be coming in to help feed and encourage fluids I have also written an order for nursing staff to push fluids aggressively as well.  [id number] note greater than 25 minutes spent assessing patient-discussing her status with her daughter at bedside as well as with nursing staff-and coordinating a plan of care

## 2016-11-24 ENCOUNTER — Other Ambulatory Visit (HOSPITAL_COMMUNITY)
Admission: AD | Admit: 2016-11-24 | Discharge: 2016-11-24 | Disposition: A | Discharge status: Home / Self Care | Payer: Medicare Other | Source: Skilled Nursing Facility | Attending: Internal Medicine | Admitting: Internal Medicine

## 2016-11-24 DIAGNOSIS — A0472 Enterocolitis due to Clostridium difficile, not specified as recurrent: Secondary | ICD-10-CM | POA: Insufficient documentation

## 2016-11-24 LAB — C DIFFICILE QUICK SCREEN W PCR REFLEX
C DIFFICILE (CDIFF) INTERP: DETECTED
C DIFFICLE (CDIFF) ANTIGEN: POSITIVE — AB
C Diff toxin: POSITIVE — AB

## 2016-11-25 ENCOUNTER — Encounter: Payer: Self-pay | Admitting: Internal Medicine

## 2016-11-25 ENCOUNTER — Non-Acute Institutional Stay (SKILLED_NURSING_FACILITY): Payer: Medicare Other | Admitting: Internal Medicine

## 2016-11-25 DIAGNOSIS — R627 Adult failure to thrive: Secondary | ICD-10-CM | POA: Diagnosis not present

## 2016-11-25 DIAGNOSIS — A0472 Enterocolitis due to Clostridium difficile, not specified as recurrent: Secondary | ICD-10-CM | POA: Diagnosis not present

## 2016-11-25 DIAGNOSIS — G20 Parkinson's disease: Secondary | ICD-10-CM | POA: Diagnosis not present

## 2016-11-25 NOTE — Progress Notes (Signed)
Location:   [facility] Room Number: 133/P Place of Service:  SNF [phone]) Provider:  Keitha Butte, Kathleene Hazel, MD  Patient Care Team: Benita Stabile, MD as PCP - General (Internal Medicine)  Extended Emergency Contact Information Primary Emergency Contact: Clint Lipps Address: [address]          [place], [place] Home Phone: [phone] Work Phone: [phone] Mobile Phone: [phone] Relation: Brother Secondary Emergency Contact: Causey,Marcine Address: [address]          [place], [place] [address] [place] Home Phone: [phone] Relation: Daughter  Code Status:  DNR/Hospice Goals of care: Advanced Directive information Advanced Directives 11/25/2016  Does Patient Have a Medical Advance Directive? Yes  Type of Advance Directive Out of facility DNR (pink MOST or yellow form)  Does patient want to make changes to medical advance directive? No - Patient declined  Would patient like information on creating a medical advance directive? -  Pre-existing out of facility DNR order (yellow form or pink MOST form) -     Chief Complaint  Patient presents with  . Acute Visit    F/U C-Diff    HPI:  Pt is a 83 y.o. female seen today for an acute visit for  Treatment for C.Diff Colitis.  Patient has h/o parkinson disease, recurrent UTI, Syncope, orthostatic Hypotension, recurrent falls, CKD, GERD, hypertension and Hypothyroid. She is in the SNF for End of life care and is enrolled in Hospice. She was recently seen for C/o Diarrhea and had stool checked for C.DIff which is positive. She is unable to give me any history. Sitting in the Chair and is looks comfortable    Past Medical History:  Diagnosis Date  . GERD (gastroesophageal reflux disease)   . Hypotension   . Thyroid disease    Past Surgical History:  Procedure Laterality Date  . ABDOMINAL HYSTERECTOMY    . HIP SURGERY  10/23/09   left  hip partial replacement, kept socket and had ball and shaft replaced    No Known Allergies  Outpatient Encounter Prescriptions as of 11/25/2016  Medication Sig  . Balsam [place]-Castor Oil (VENELEX) OINT Apply topically. Apply to sacrum and bilateral buttocks q shift every shift  . carbidopa-levodopa (SINEMET IR) 25-100 MG tablet Take 1 tablet by mouth at bedtime.  . haloperidol (HALDOL) 2 MG/ML solution Give 1 ml by mouth once an evening  . ibuprofen (ADVIL,MOTRIN) 200 MG tablet Take 400 mg by mouth 4 (four) times daily.  . metroNIDAZOLE (FLAGYL) 500 MG tablet Take 500 mg by mouth 3 (three) times daily.  Marland Kitchen morphine 20 MG/5ML solution Take 4 mg by mouth every 2 (two) hours as needed for pain.  Marland Kitchen senna (SENOKOT) 8.6 MG TABS tablet Take 1 tablet by mouth 4 (four) times daily.  . sertraline (ZOLOFT) 50 MG tablet Take 50 mg by mouth daily.   No facility-administered encounter medications on file as of 11/25/2016.      Review of Systems  Reason unable to perform ROS: Patient was unable to give any History and her daughter is not there to help.    Immunization History  Administered Date(s) Administered  . Pneumococcal Polysaccharide-23 10/27/2005   Pertinent  Health Maintenance Due  Topic Date Due  . INFLUENZA VACCINE  12/27/2016 (Originally 08/27/2016)  . DEXA SCAN  12/27/2016 (Originally 07/19/1991)  . PNA vac Low Risk Adult (2 of 2 - PCV13) 12/27/2016 (Originally 10/28/2006)  No flowsheet data found. Functional Status Survey:    Vitals:   11/25/16 1347  BP: 110/66  Pulse: 61  Resp: 20  Temp: (!) 97.4 F (36.3 C)   There is no height or weight on file to calculate BMI. Physical Exam  Constitutional: She appears well-developed and well-nourished.  HENT:  Head: Normocephalic.  Mouth/Throat: Oropharynx is clear and moist.  Eyes: Pupils are equal, round, and reactive to light.  Neck: Neck supple.  Cardiovascular: Normal rate and normal heart sounds.   Pulmonary/Chest: Effort  normal and breath sounds normal. No respiratory distress. She has no wheezes. She has no rales.  Abdominal: Soft. Bowel sounds are normal. She exhibits no distension. There is no tenderness. There is no rebound.  Musculoskeletal: She exhibits no edema.  Neurological: She is alert.  Would Follow some commands  Skin:  Has Contracture and Bruises on her hands    Labs reviewed: No results for input(s): NA, K, CL, CO2, GLUCOSE, BUN, CREATININE, CALCIUM, MG, PHOS in the last 8760 hours. No results for input(s): AST, ALT, ALKPHOS, BILITOT, PROT, ALBUMIN in the last 8760 hours. No results for input(s): WBC, NEUTROABS, HGB, HCT, MCV, PLT in the last 8760 hours.  No results found for: HGBA1C No results found for: CHOL, HDL, LDLCALC, LDLDIRECT, TRIG, CHOLHDL  Significant Diagnostic Results in last 30 days:  No results found.  Assessment/Plan  C Diff Colitis Will start Patient on Oral Vancomycin 10 days. Contact Isolation  End of life Care Patient Comfortable . Her Anxiey is controlled on Haldol and Zoloft. She is on Morphine prn Sinemet for tremors.    Family/ staff Communication:   Labs/tests ordered:

## 2016-12-01 ENCOUNTER — Non-Acute Institutional Stay (SKILLED_NURSING_FACILITY): Payer: Medicare Other | Admitting: Internal Medicine

## 2016-12-01 ENCOUNTER — Encounter: Payer: Self-pay | Admitting: Internal Medicine

## 2016-12-01 DIAGNOSIS — R600 Localized edema: Secondary | ICD-10-CM | POA: Diagnosis not present

## 2016-12-01 DIAGNOSIS — R627 Adult failure to thrive: Secondary | ICD-10-CM

## 2016-12-01 DIAGNOSIS — A0472 Enterocolitis due to Clostridium difficile, not specified as recurrent: Secondary | ICD-10-CM | POA: Diagnosis not present

## 2016-12-01 DIAGNOSIS — G20 Parkinson's disease: Secondary | ICD-10-CM

## 2016-12-01 NOTE — Progress Notes (Signed)
Location:   [facility] Room Number: 133/P Place of Service:  SNF (31) Provider:  Berlin Hun, Kathleene Hazel, MD  Patient Care Team: Benita Stabile, MD as PCP - General (Internal Medicine)  Extended Emergency Contact Information Primary Emergency Contact: Clint Lipps Address: [address]          [place], [place] Home Phone: [phone] Work Phone: [phone] Mobile Phone: [phone] Relation: Brother Secondary Emergency Contact: Causey,Marcine Address: [address]          [place], [place] [address] [place] Home Phone: [phone] Relation: Daughter  Code Status:  DNR/Hospice Goals of care: Advanced Directive information Advanced Directives 12/01/2016  Does Patient Have a Medical Advance Directive? Yes  Type of Advance Directive Out of facility DNR (pink MOST or yellow form)  Does patient want to make changes to medical advance directive? No - Patient declined  Would patient like information on creating a medical advance directive? -  Pre-existing out of facility DNR order (yellow form or pink MOST form) -     Chief Complaint  Patient presents with  . Medical Management of Chronic Issues    Routine Visit  for medical management of chronic medical conditions including failure to thrive-Parkinson's disease-depression-recent C. Difficile positive culture  HPI:  Pt is a 83 y.o. female seen today for medical management of chronic diseases. As noted above.  Patient continues under hospice services she had been under hospice services in [place] but has moved here to be closer apparently to family-  She is on minimal medications-actually her Sinemet was restarted at night secondary to family concerns she was having increased tremors.  She also recently was noted to have some diarrhea this did not really have a C. Difficile presentation but culture was obtained which surprisingly came back positive  she is currently receiving vancomycin appears to be stable in this regards apparently diarrhea has improved   She does have a history of hronic kidney disease GERD hypertension and hypothyroidism as well --again under very conservative care secondary to being in stages of life with concerns for minimal invasive interventions.  SheRecently has had some increased right hand edema-x-rays and Dopplers were negative for any acute process a suspect this is dependent related-she does currently have an elevated this appears to have improved somewhat  She does have some history of hypotension but appears to be asymptomatic at this point blood pressure today was 108/65-she is not on any medications.  For pain she is on Roxanol as needed as well as ibuprofen when necessary she appears to be comfortable today resting in bed   Nursing staff does not report any acute issues.         Past Medical History:  Diagnosis Date  . GERD (gastroesophageal reflux disease)   . Hypotension   . Thyroid disease    Past Surgical History:  Procedure Laterality Date  . ABDOMINAL HYSTERECTOMY    . HIP SURGERY  10/23/09   left hip partial replacement, kept socket and had ball and shaft replaced    No Known Allergies  Outpatient Encounter Medications as of 12/01/2016  Medication Sig  . Balsam [place]-Castor Oil (VENELEX) OINT Apply topically. Apply to sacrum and bilateral buttocks q shift every shift  . carbidopa-levodopa (SINEMET IR) 25-100 MG tablet Take 1 tablet by mouth at bedtime.  . haloperidol (HALDOL) 2 MG/ML solution Give 1 ml by mouth once an evening  . ibuprofen (ADVIL,MOTRIN)  200 MG tablet Take 400 mg by mouth 4 (four) times daily.  Marland Kitchen. morphine 20 MG/5ML solution Take 5 mg every 2 (two) hours as needed by mouth for pain.   Marland Kitchen. senna (SENOKOT) 8.6 MG TABS tablet Take 1 tablet by mouth 4 (four) times daily.  . sertraline (ZOLOFT) 50 MG tablet Take 50 mg by mouth daily.  . vancomycin (VANCOCIN) 125 MG  capsule Take 125 mg every 6 (six) hours by mouth.  . [DISCONTINUED] metroNIDAZOLE (FLAGYL) 500 MG tablet Take 500 mg by mouth 3 (three) times daily.   No facility-administered encounter medications on file as of 12/01/2016.      Review of Systems   Essentially unattainable since patient is not really speaking today-nursing does not report any acute issues  Immunization History  Administered Date(s) Administered  . Pneumococcal Polysaccharide-23 10/27/2005   Pertinent  Health Maintenance Due  Topic Date Due  . INFLUENZA VACCINE  12/27/2016 (Originally 08/27/2016)  . DEXA SCAN  12/27/2016 (Originally 07/19/1991)  . PNA vac Low Risk Adult (2 of 2 - PCV13) 12/27/2016 (Originally 10/28/2006)   No flowsheet data found. Functional Status Survey:    Vitals:   12/01/16 1525  BP: 108/65  Pulse: 80  Resp: 20  Temp: (!) 97.3 F (36.3 C)  TempSrc: Oral  Weight: 129 lb 9.6 oz (58.8 kg)  Height: 5\' 9"  (1.753 m)    Physical Exam\  In general this is a frail elderly female in no distress lying comfortably in bed.  Her skin is warm and dry continues with some cool edema of her right hand this does not appear to be tender or warm.  Eyes she has prescription lenses visual acuity appears grossly intact sclera and conjunctiva are clear.  Oropharynx from what I can see is clear although she did not open her mouth very wide-he just membranes were difficult to fully assess.  Chest is clear to auscultation with somewhat shallow air entry there is no labored breathing.  Heart is regular rate and rhythm without murmur gallop or rub-she does not have significant lower extremity edema.  Musculoskeletal continues with generalized frailty with right hand edema which appears to be stable to possibly improving.  Does not really respond to verbal commands to move her extremities.  Neurologic she appears to be alert and makes eye contact speaks very softly and minimally.  Psych as noted above  Labs  reviewed: No results for input(s): NA, K, CL, CO2, GLUCOSE, BUN, CREATININE, CALCIUM, MG, PHOS in the last 8760 hours. No results for input(s): AST, ALT, ALKPHOS, BILITOT, PROT, ALBUMIN in the last 8760 hours. No results for input(s): WBC, NEUTROABS, HGB, HCT, MCV, PLT in the last 8760 hours.  No results found for: HGBA1C No results found for: CHOL, HDL, LDLCALC, LDLDIRECT, TRIG, CHOLHDL  Significant Diagnostic Results in last 30 days:  No results found.  Assessment/Plan  #1h-istory of Parkinson's disease with end-stage prognosis-at this point she appears to be doing well with supportive care we did start Sinemet at night-apparently this is helping with the tremors-she appears to be comfortable and has Roxanol as well as when necessary ibuprofen for pain.  #2 dementia with agitation she does have an order for Haldol  in the afternoon apparently this has helped: --sheis calm and cooperative although not really talking much today she is alert  #3-history of C. Difficile she has been started on vancomycin she is completing a one-week course this appears to be improvingshe continues on precautions.  #4 depression she  is on Zoloftat this point will monitor.  #5 hand edema again this appears to be baseline to possibly slightly improved her hand is elevated today and this will have to be encouraged she does not appear to be having pain with this.  Again she continues under hospice services with follows her closely as well at this point will continue her current minimal medications and monitor  [id number]

## 2016-12-03 ENCOUNTER — Non-Acute Institutional Stay (SKILLED_NURSING_FACILITY): Payer: Medicare Other | Admitting: Internal Medicine

## 2016-12-03 ENCOUNTER — Encounter: Payer: Self-pay | Admitting: Internal Medicine

## 2016-12-03 DIAGNOSIS — A0472 Enterocolitis due to Clostridium difficile, not specified as recurrent: Secondary | ICD-10-CM

## 2016-12-03 DIAGNOSIS — H00012 Hordeolum externum right lower eyelid: Secondary | ICD-10-CM

## 2016-12-03 DIAGNOSIS — G20 Parkinson's disease: Secondary | ICD-10-CM

## 2016-12-03 NOTE — Progress Notes (Signed)
Location:   [facility] Room Number: 133/P Place of Service:  SNF [phone]) Provider:  Loraine LericheArlo Siah Kannan  Hall, John Z, MD  Patient Care Team: Benita StabileHall, John Z, MD as PCP - General (Internal Medicine)  Extended Emergency Contact Information Primary Emergency Contact: Clint LippsStrader,Billy Address: [address]          [place], [place] Home Phone: [phone] Work Phone: [phone] Mobile Phone: [phone] Relation: Brother Secondary Emergency Contact: Causey,Marcine Address: [address]          [place], [place] [id number] [place] Home Phone: [phone] Relation: Daughter  Code Status:  DNR/Hospice Goals of care: Advanced Directive information Advanced Directives 12/03/2016  Does Patient Have a Medical Advance Directive? Yes  Type of Advance Directive Out of facility DNR (pink MOST or yellow form)  Does patient want to make changes to medical advance directive? -  Would patient like information on creating a medical advance directive? -  Pre-existing out of facility DNR order (yellow form or pink MOST form) -    Chief complaint-acute visit secondary to right eye issues-f/u C-diff  HPI:  Pt is a 83 y.o. female seen today for an acute visit for  lesion under her right eye--s well as follow-up for C. Difficile.  Patient is under  Hospice services--with a history of Parkinson's disease-failure to thrive-chronic kidney disease-hypothyroidism  Nursing staff has noted a lesion under her righteye that I am following up on--she also is being treated for C. difficile-she did have some diarrhea-we have obtained a culture whichsomewhat surprisingly was positive--.  According to nursing the diarrhea is gradually improving although she still has some  Vital signs appear to be stable she is not complaining of any visual changes or abdominal discomfortalthough she does notspeak much at all   Past Medical History:  Diagnosis Date    . GERD (gastroesophageal reflux disease)   . Hypotension   . Thyroid disease    Past Surgical History:  Procedure Laterality Date  . ABDOMINAL HYSTERECTOMY    . HIP SURGERY  10/23/09   left hip partial replacement, kept socket and had ball and shaft replaced    No Known Allergies  Outpatient Encounter Medications as of 12/03/2016  Medication Sig  . Balsam [place]-Castor Oil (VENELEX) OINT Apply topically. Apply to sacrum and bilateral buttocks q shift every shift  . carbidopa-levodopa (SINEMET IR) 25-100 MG tablet Take 1 tablet by mouth at bedtime.  . haloperidol (HALDOL) 2 MG/ML solution Give 1 ml by mouth once an evening  . ibuprofen (ADVIL,MOTRIN) 200 MG tablet Take 400 mg by mouth 4 (four) times daily.  Marland Kitchen. morphine 20 MG/5ML solution Take 5 mg every 2 (two) hours as needed by mouth for pain.   Marland Kitchen. senna (SENOKOT) 8.6 MG TABS tablet Take 1 tablet by mouth 4 (four) times daily.  . sertraline (ZOLOFT) 50 MG tablet Take 50 mg by mouth daily.  . vancomycin (VANCOCIN) 125 MG capsule Take 125 mg every 6 (six) hours by mouth.   No facility-administered encounter medications on file as of 12/03/2016.     Review of Systems   Essentially  Unattainable secondary to patient not speaking-please see history of present illness  Immunization History  Administered Date(s) Administered  . Pneumococcal Polysaccharide-23 10/27/2005   Pertinent  Health Maintenance Due  Topic Date Due  . INFLUENZA VACCINE  12/27/2016 (Originally 08/27/2016)  . DEXA SCAN  12/27/2016 (Originally 07/19/1991)  . PNA vac Low  Risk Adult (2 of 2 - PCV13) 12/27/2016 (Originally 10/28/2006)   No flowsheet data found. Functional Status Survey:    Vitals:   12/03/16 1616  BP: 106/64  Pulse: 61  Resp: 20  Temp: 98.4 F (36.9 C)  TempSrc: Oral    Physical Exam   In general this is a frail elderly female in no distress resting comfortably in bed.  Her skin is warm and dry does have cool edema of her  Right hand and a  lesser extent her left hand  Eyes sclera and conjunctivae are clear --visual acuity appears grossly intact do not see any exudate  Lower right eye  lid there is a small raised area that appears to be a stye--no evidence of surrounding  erythema or cellulitis  Chest is clear to auscultation with poor respiratory effort there is no labored breathing.  Heart is regular re and rhythm without murmur gallop or rub she does not have lower extremity edema.  Abdomen is soft does not appear to be tender.  Musculoskeletal has some contractures of her upper extremities bilat and frailty of her lower extrmities0she does have some  edema her hands   neurologic--grossly intact--cn's intact--she isalert  [place]--pleasant, smiling,not speaking at this time  Labs reviewed: No results for input(s): NA, K, CL, CO2, GLUCOSE, BUN, CREATININE, CALCIUM, MG, PHOS in the last 8760 hours. No results for input(s): AST, ALT, ALKPHOS, BILITOT, PROT, ALBUMIN in the last 8760 hours. No results for input(s): WBC, NEUTROABS, HGB, HCT, MCV, PLT in the last 8760 hours.  No results found for: HGBA1C No results found for: CHOL, HDL, LDLCALC, LDLDIRECT, TRIG, CHOLHDL  Significant Diagnostic Results in last 30 days:  No results found.  Assessment/Plan #1 right eyelid stye-will treat with warm compresses and monitor if no resolution notify provider.  #2 C. Difficile colitis continues on vancomycin-this appears to be resolving but  Still has some residual diarrhea-she has 3 days remaining of vancomycin at this point will monitor  #3 failure to thrive-with history of Parkinson's-continues on Sinemet at night-she is under hospice services and appears to be comfortable-she does have orders for morphine as well as ibuprofen when necessary.  [id number]       [phone]

## 2016-12-08 IMAGING — CT CT HEAD W/O CM
1 series · 15 of 30 positions shown, 19 images · non-contrast
Comparison: 09/20/2010

CLINICAL DATA: Nausea, weakness and fatigue since spinal steroid
injection 4 weeks ago.

EXAM:
CT HEAD WITHOUT CONTRAST
TECHNIQUE: Contiguous axial images were obtained from the base of the skull
through the vertex without intravenous contrast.

[Series 2: head 5.0 h30s · axial · 0.46mm/px · z∈[-327,-182]mm · 15 of 33 slices shown, 19 images]
[im 2/33  brain]
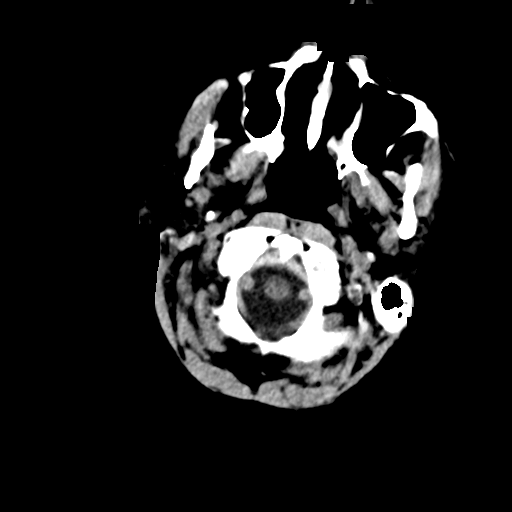
[im 2/33  bone]
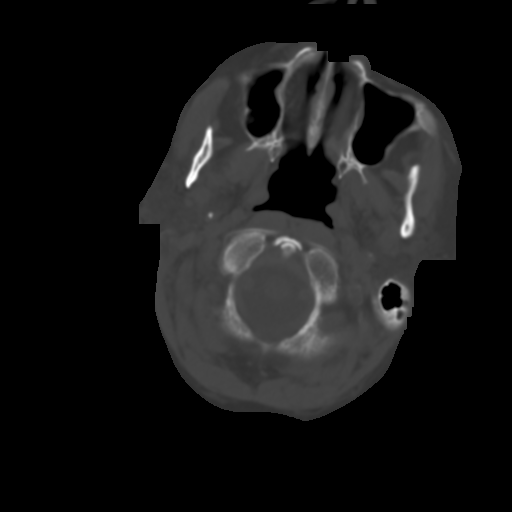
[im 4/33  brain]
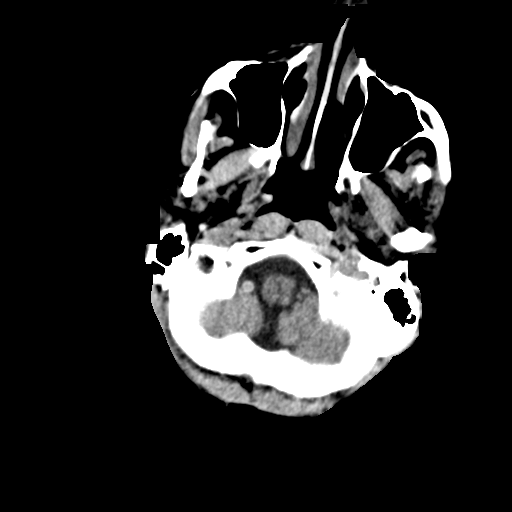
[im 6/33  brain]
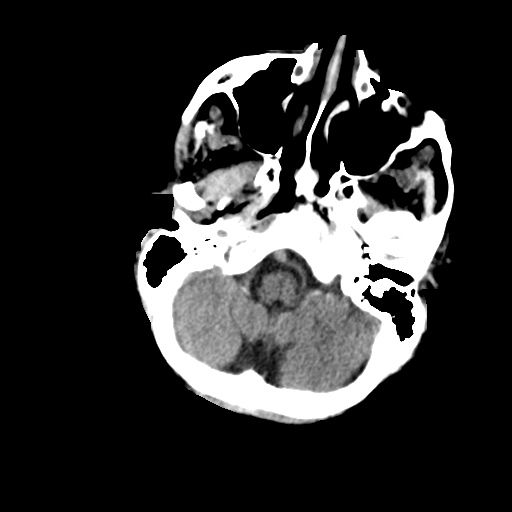
[im 8/33  brain]
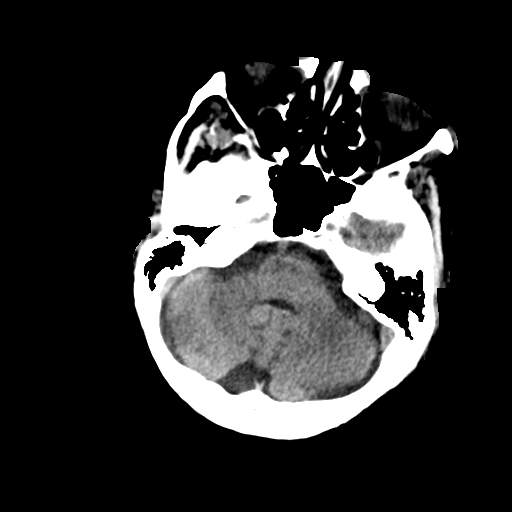
[im 10/33  brain]
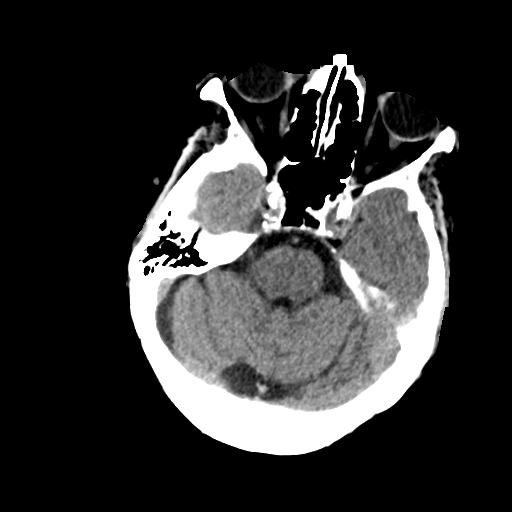
[im 10/33  bone]
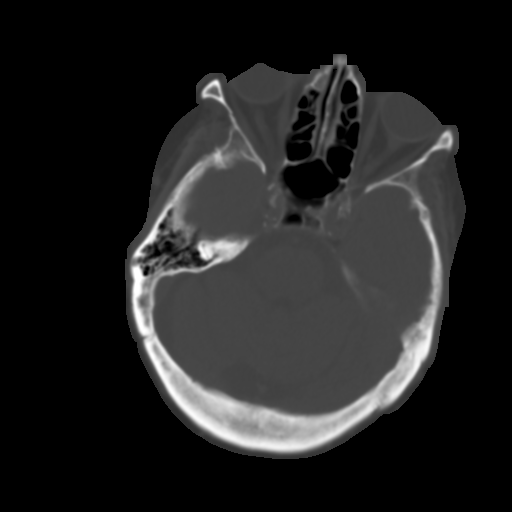
[im 13/33  brain]
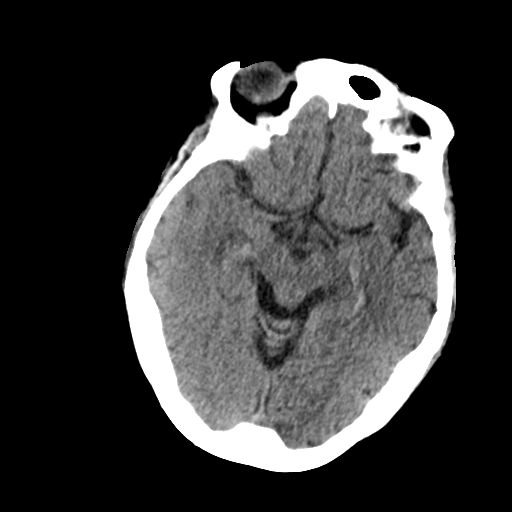
[im 15/33  brain]
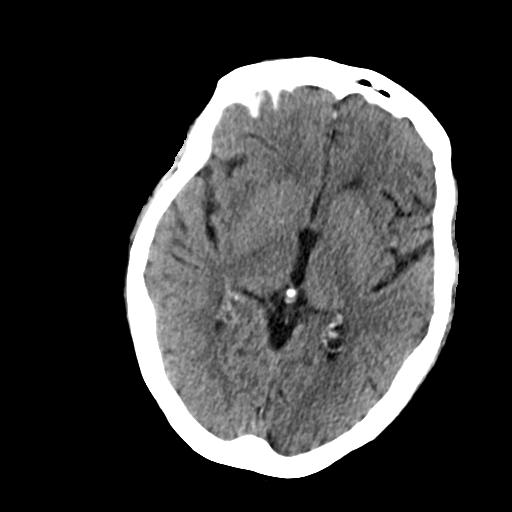
[im 17/33  brain]
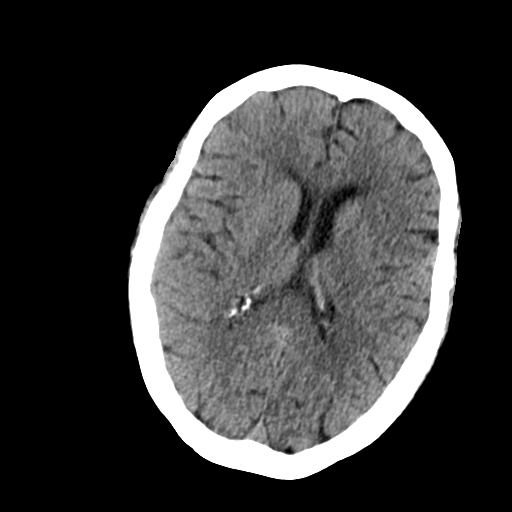
[im 18/33  brain]
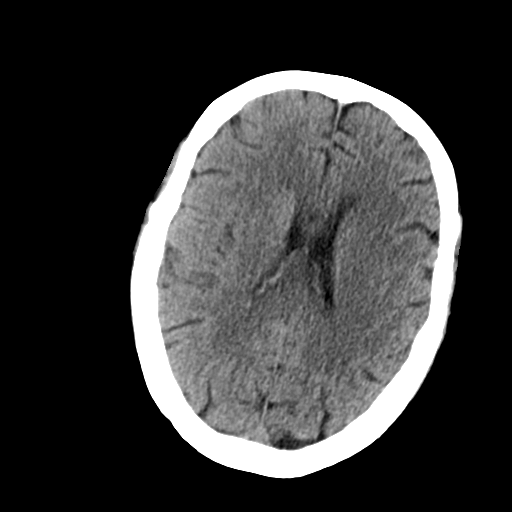
[im 18/33  bone]
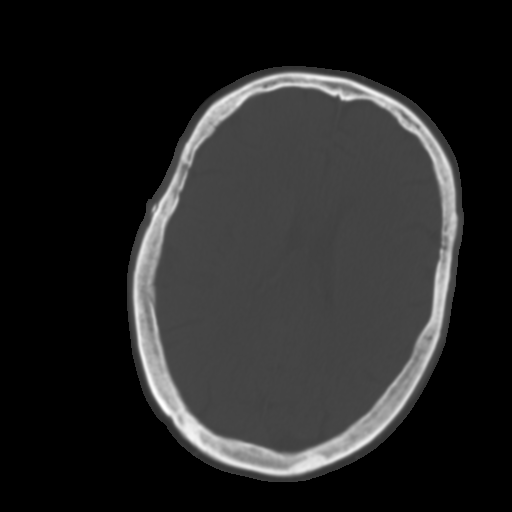
[im 20/33  brain]
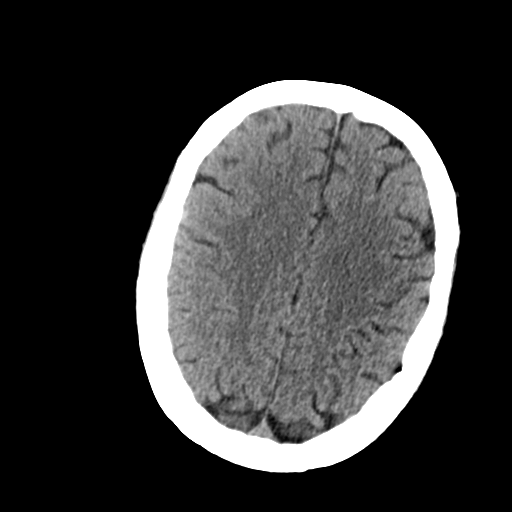
[im 23/33  brain]
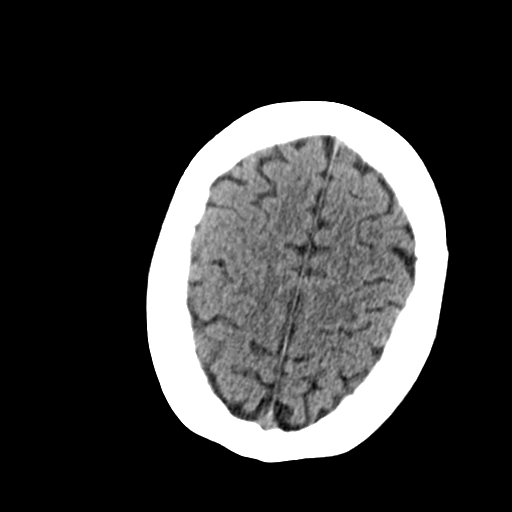
[im 25/33  brain]
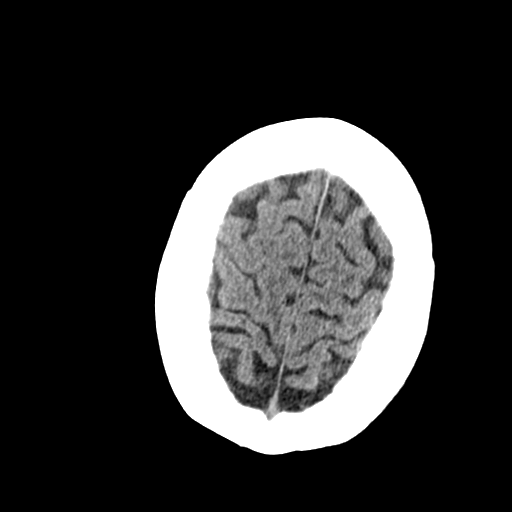
[im 27/33  brain]
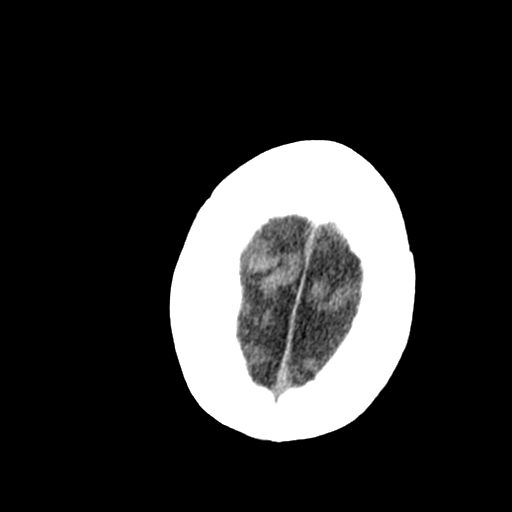
[im 27/33  bone]
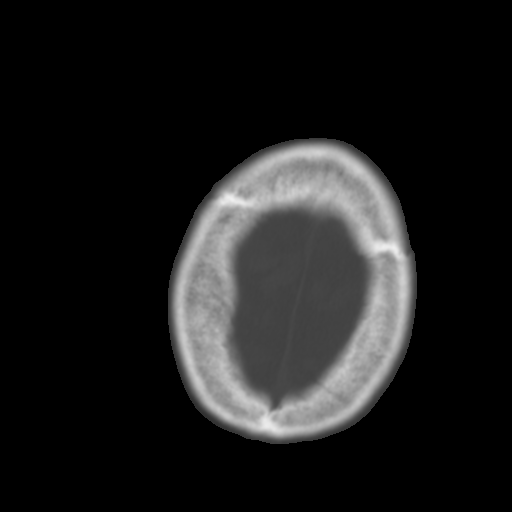
[im 29/33  brain]
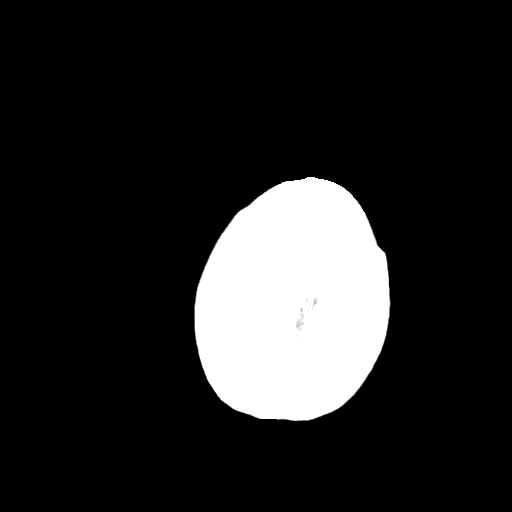
[im 31/33  brain]
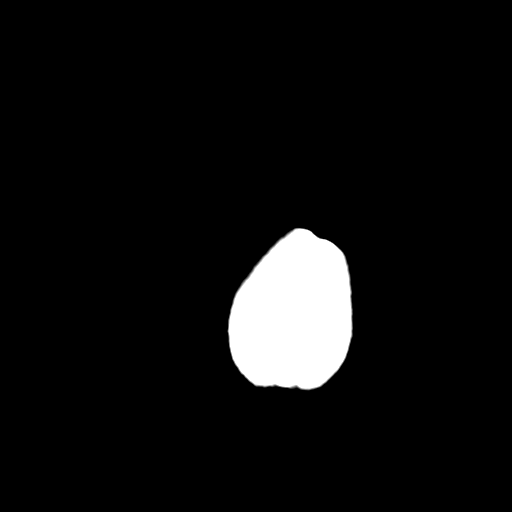

[15 of 30 positions shown; findings below may reference images not displayed]

FINDINGS: The brain shows mild generalized atrophy. There is atherosclerotic
calcification of the major vessels at the base of the brain. There
is questionable low density in the left cerebellum. This could
simply be secondary to streak artifact from the skullbase, but a
cerebellar infarction is not completely excluded. No focal finding
in the cerebral hemispheres. No mass lesion, hemorrhage,
hydrocephalus or extra-axial collection. No calvarial abnormality.
Sinuses, middle ears and mastoids are clear.
IMPRESSION: No definite acute finding. Questionable low-density in the left
cerebellum. I favor that this represents beam attenuation from dense
bone at the skullbase, but a left cerebellar infarction is not
completely excluded. If this is a clinical possibility, MRI could
make that distinction. If the clinical presentation is not
consistent with stroke, this is probably technical.

## 2016-12-09 ENCOUNTER — Non-Acute Institutional Stay (SKILLED_NURSING_FACILITY): Payer: Medicare Other

## 2016-12-09 DIAGNOSIS — Z0000 Encounter for general adult medical examination without abnormal findings: Secondary | ICD-10-CM | POA: Diagnosis not present

## 2016-12-09 NOTE — Patient Instructions (Signed)
Ms. Bonnie Curtis , Thank you for taking time to come for your Medicare Wellness Visit. I appreciate your ongoing commitment to your health goals. Please review the following plan we discussed and let me know if I can assist you in the future.   Screening recommendations/referrals: Colonoscopy excluded, pt is over age 81 Mammogram excluded, pt is over age 81 Bone Density due, not ordered-hospice patient Recommended yearly ophthalmology/optometry visit for glaucoma screening and checkup Recommended yearly dental visit for hygiene and checkup  Vaccinations: Influenza vaccine up to date Pneumococcal vaccine 13 due, not ordered-hospice patient Tdap vaccine due, not ordered-hospice patient Shingles vaccine not in records, not ordered-hospice patient    Advanced directives: Copy of living will and health care power of attorney needed for chart  Conditions/risks identified: none  Next appointment: Dr. Chales AbrahamsGupta makes rounds   Preventive Care 65 Years and Older, Female Preventive care refers to lifestyle choices and visits with your health care provider that can promote health and wellness. What does preventive care include?  A yearly physical exam. This is also called an annual well check.  Dental exams once or twice a year.  Routine eye exams. Ask your health care provider how often you should have your eyes checked.  Personal lifestyle choices, including:  Daily care of your teeth and gums.  Regular physical activity.  Eating a healthy diet.  Avoiding tobacco and drug use.  Limiting alcohol use.  Practicing safe sex.  Taking low-dose aspirin every day.  Taking vitamin and mineral supplements as recommended by your health care provider. What happens during an annual well check? The services and screenings done by your health care provider during your annual well check will depend on your age, overall health, lifestyle risk factors, and family history of disease. Counseling  Your  health care provider may ask you questions about your:  Alcohol use.  Tobacco use.  Drug use.  Emotional well-being.  Home and relationship well-being.  Sexual activity.  Eating habits.  History of falls.  Memory and ability to understand (cognition).  Work and work Astronomerenvironment.  Reproductive health. Screening  You may have the following tests or measurements:  Height, weight, and BMI.  Blood pressure.  Lipid and cholesterol levels. These may be checked every 5 years, or more frequently if you are over 81 years old.  Skin check.  Lung cancer screening. You may have this screening every year starting at age 81 if you have a 30-pack-year history of smoking and currently smoke or have quit within the past 15 years.  Fecal occult blood test (FOBT) of the stool. You may have this test every year starting at age 81.  Flexible sigmoidoscopy or colonoscopy. You may have a sigmoidoscopy every 5 years or a colonoscopy every 10 years starting at age 750.  Hepatitis C blood test.  Hepatitis B blood test.  Sexually transmitted disease (STD) testing.  Diabetes screening. This is done by checking your blood sugar (glucose) after you have not eaten for a while (fasting). You may have this done every 1-3 years.  Bone density scan. This is done to screen for osteoporosis. You may have this done starting at age 81.  Mammogram. This may be done every 1-2 years. Talk to your health care provider about how often you should have regular mammograms. Talk with your health care provider about your test results, treatment options, and if necessary, the need for more tests. Vaccines  Your health care provider may recommend certain vaccines, such as:  Influenza vaccine. This is recommended every year.  Tetanus, diphtheria, and acellular pertussis (Tdap, Td) vaccine. You may need a Td booster every 10 years.  Zoster vaccine. You may need this after age 55.  Pneumococcal 13-valent  conjugate (PCV13) vaccine. One dose is recommended after age 73.  Pneumococcal polysaccharide (PPSV23) vaccine. One dose is recommended after age 11. Talk to your health care provider about which screenings and vaccines you need and how often you need them. This information is not intended to replace advice given to you by your health care provider. Make sure you discuss any questions you have with your health care provider. Document Released: 02/09/2015 Document Revised: 10/03/2015 Document Reviewed: 11/14/2014 Elsevier Interactive Patient Education  2017 [place] Prevention in the Home Falls can cause injuries. They can happen to people of all ages. There are many things you can do to make your home safe and to help prevent falls. What can I do on the outside of my home?  Regularly fix the edges of walkways and driveways and fix any cracks.  Remove anything that might make you trip as you walk through a door, such as a raised step or threshold.  Trim any bushes or trees on the path to your home.  Use bright outdoor lighting.  Clear any walking paths of anything that might make someone trip, such as rocks or tools.  Regularly check to see if handrails are loose or broken. Make sure that both sides of any steps have handrails.  Any raised decks and porches should have guardrails on the edges.  Have any leaves, snow, or ice cleared regularly.  Use sand or salt on walking paths during winter.  Clean up any spills in your garage right away. This includes oil or grease spills. What can I do in the bathroom?  Use night lights.  Install grab bars by the toilet and in the tub and shower. Do not use towel bars as grab bars.  Use non-skid mats or decals in the tub or shower.  If you need to sit down in the shower, use a plastic, non-slip stool.  Keep the floor dry. Clean up any water that spills on the floor as soon as it happens.  Remove soap buildup in the tub or  shower regularly.  Attach bath mats securely with double-sided non-slip rug tape.  Do not have throw rugs and other things on the floor that can make you trip. What can I do in the bedroom?  Use night lights.  Make sure that you have a light by your bed that is easy to reach.  Do not use any sheets or blankets that are too big for your bed. They should not hang down onto the floor.  Have a firm chair that has side arms. You can use this for support while you get dressed.  Do not have throw rugs and other things on the floor that can make you trip. What can I do in the kitchen?  Clean up any spills right away.  Avoid walking on wet floors.  Keep items that you use a lot in easy-to-reach places.  If you need to reach something above you, use a strong step stool that has a grab bar.  Keep electrical cords out of the way.  Do not use floor polish or wax that makes floors slippery. If you must use wax, use non-skid floor wax.  Do not have throw rugs and other things on the floor that  can make you trip. What can I do with my stairs?  Do not leave any items on the stairs.  Make sure that there are handrails on both sides of the stairs and use them. Fix handrails that are broken or loose. Make sure that handrails are as long as the stairways.  Check any carpeting to make sure that it is firmly attached to the stairs. Fix any carpet that is loose or worn.  Avoid having throw rugs at the top or bottom of the stairs. If you do have throw rugs, attach them to the floor with carpet tape.  Make sure that you have a light switch at the top of the stairs and the bottom of the stairs. If you do not have them, ask someone to add them for you. What else can I do to help prevent falls?  Wear shoes that:  Do not have high heels.  Have rubber bottoms.  Are comfortable and fit you well.  Are closed at the toe. Do not wear sandals.  If you use a stepladder:  Make sure that it is fully  opened. Do not climb a closed stepladder.  Make sure that both sides of the stepladder are locked into place.  Ask someone to hold it for you, if possible.  Clearly mark and make sure that you can see:  Any grab bars or handrails.  First and last steps.  Where the edge of each step is.  Use tools that help you move around (mobility aids) if they are needed. These include:  Canes.  Walkers.  Scooters.  Crutches.  Turn on the lights when you go into a dark area. Replace any light bulbs as soon as they burn out.  Set up your furniture so you have a clear path. Avoid moving your furniture around.  If any of your floors are uneven, fix them.  If there are any pets around you, be aware of where they are.  Review your medicines with your doctor. Some medicines can make you feel dizzy. This can increase your chance of falling. Ask your doctor what other things that you can do to help prevent falls. This information is not intended to replace advice given to you by your health care provider. Make sure you discuss any questions you have with your health care provider. Document Released: 11/09/2008 Document Revised: 06/21/2015 Document Reviewed: 02/17/2014 Elsevier Interactive Patient Education  2017 [employer].

## 2016-12-09 NOTE — Progress Notes (Signed)
Subjective:   Bonnie Curtis is a 83 y.o. female who presents for an Initial Medicare Annual Wellness Visit at [facility]; incapacitated patient unable to answer questions appropriately, hospice patient        Objective:    Today's Vitals   12/09/16 1049  BP: 137/74  Pulse: 62  Temp: (!) 97.3 F (36.3 C)  TempSrc: Oral  SpO2: 97%  Weight: 129 lb (58.5 kg)  Height: 5\' 9"  (1.753 m)   Body mass index is 19.05 kg/m.   Current Medications (verified) Outpatient Encounter Medications as of 12/09/2016  Medication Sig  . Balsam Peru-Castor Oil (VENELEX) OINT Apply topically. Apply to sacrum and bilateral buttocks q shift every shift  . carbidopa-levodopa (SINEMET IR) 25-100 MG tablet Take 1 tablet by mouth at bedtime.  . haloperidol (HALDOL) 2 MG/ML solution Give 1 ml by mouth once an evening  . ibuprofen (ADVIL,MOTRIN) 200 MG tablet Take 400 mg by mouth 4 (four) times daily.  Marland Kitchen. morphine 20 MG/5ML solution Take 5 mg every 2 (two) hours as needed by mouth for pain.   Marland Kitchen. senna (SENOKOT) 8.6 MG TABS tablet Take 1 tablet by mouth 4 (four) times daily.  . sertraline (ZOLOFT) 50 MG tablet Take 50 mg by mouth daily.  . [DISCONTINUED] vancomycin (VANCOCIN) 125 MG capsule Take 125 mg every 6 (six) hours by mouth.   No facility-administered encounter medications on file as of 12/09/2016.     Allergies (verified) Patient has no known allergies.   History: Past Medical History:  Diagnosis Date  . GERD (gastroesophageal reflux disease)   . Hypotension   . Thyroid disease    Past Surgical History:  Procedure Laterality Date  . ABDOMINAL HYSTERECTOMY    . HIP SURGERY  10/23/09   left hip partial replacement, kept socket and had ball and shaft replaced   History reviewed. No pertinent family history. Social History   Occupational History  . Not on file  Tobacco Use  . Smoking status: Former Games developermoker  . Smokeless tobacco: Never Used  Substance and Sexual  Activity  . Alcohol use: No  . Drug use: No  . Sexual activity: No    Tobacco Counseling Counseling given: Not Answered   Activities of Daily Living In your present state of health, do you have any difficulty performing the following activities: 12/09/2016  Hearing? Y  Vision? N  Difficulty concentrating or making decisions? Y  Walking or climbing stairs? Y  Dressing or bathing? Y  Doing errands, shopping? Y  Preparing Food and eating ? Y  Using the Toilet? Y  In the past six months, have you accidently leaked urine? Y  Do you have problems with loss of bowel control? Y  Managing your Medications? Y  Managing your Finances? Y  Housekeeping or managing your Housekeeping? Y  Some recent data might be hidden    Immunizations and Health Maintenance Immunization History  Administered Date(s) Administered  . Influenza-Unspecified 10/31/2016  . Pneumococcal Polysaccharide-23 10/27/2005   There are no preventive care reminders to display for this patient.  Patient Care Team: Benita StabileHall, John Z, MD as PCP - General (Internal Medicine)  Indicate any recent Medical Services you may have received from other than [facility] providers in the past year (date may be approximate).     Assessment:   This is a routine wellness examination for Bonnie Curtis.    Hearing/Vision screen No exam data present  Dietary issues and exercise activities discussed: Current Exercise  Habits: The patient does not participate in regular exercise at present, Exercise limited by: orthopedic condition(s);neurologic condition(s)  Goals    None     Depression Screen PHQ 2/9 Scores 12/09/2016  Exception Documentation Medical reason    Fall Risk Fall Risk  12/09/2016  Falls in the past year? No    Cognitive Function: MMSE - Mini Mental State Exam 12/09/2016  Not completed: Unable to complete        Screening Tests Health Maintenance  Topic Date Due  . DEXA SCAN  12/27/2016 (Originally [birth year]/1991)  .  TETANUS/TDAP  12/27/2016 (Originally 07/18/1943)  . PNA vac Low Risk Adult (2 of 2 - PCV13) 12/27/2016 (Originally 10/28/2006)  . INFLUENZA VACCINE  Completed      Plan:    I have personally reviewed and addressed the Medicare Annual Wellness questionnaire and have noted the following in the patient's chart:  A. Medical and social history B. Use of alcohol, tobacco or illicit drugs  C. Current medications and supplements D. Functional ability and status E.  Nutritional status F.  Physical activity G. Advance directives H. List of other physicians I.  Hospitalizations, surgeries, and ER visits in previous 12 months J.  Vitals K. Screenings to include hearing, vision, cognitive, depression L. Referrals and appointments - none  In addition, I am unable to review and discuss with incapacitated patient certain preventive protocols, quality metrics, and best practice recommendations. A written personalized care plan for preventive services as well as general preventive health recommendations were provided to patient.   See attached scanned questionnaire for additional information.   Signed,   Annetta MawSara Gonthier, RN Nurse Health Advisor   Quick Notes   Health Maintenance: DEXA, TDAP, PNA 13 due- not ordered pt is hospice     Abnormal Screen: Unable to complete mental exam     Patient Concerns: None     Nurse Concerns: None

## 2016-12-22 ENCOUNTER — Non-Acute Institutional Stay (SKILLED_NURSING_FACILITY): Payer: Medicare Other | Admitting: Internal Medicine

## 2016-12-22 ENCOUNTER — Encounter: Payer: Self-pay | Admitting: Internal Medicine

## 2016-12-22 DIAGNOSIS — R52 Pain, unspecified: Secondary | ICD-10-CM

## 2016-12-22 DIAGNOSIS — Z515 Encounter for palliative care: Secondary | ICD-10-CM

## 2016-12-22 NOTE — Progress Notes (Signed)
Location:   [facility] Room Number: 133/P Place of Service:  SNF [id number]) Provider:  Keitha ButteAnjali,Gupta  Hall, Kathleene HazelJohn Z, MD  Patient Care Team: Benita StabileHall, John Z, MD as PCP - General (Internal Medicine)  Extended Emergency Contact Information Primary Emergency Contact: Clint LippsStrader,Billy Address: [address]          [place], [place] Home Phone: [phone] Work Phone: [phone] Mobile Phone: [phone] Relation: Brother Secondary Emergency Contact: Causey,Marcine Address: [address]          [place], [place] [id number] [place] Home Phone: [phone] Relation: Daughter  Code Status:  DNR/ Hospice Goals of care: Advanced Directive information Advanced Directives 12/22/2016  Does Patient Have a Medical Advance Directive? Yes  Type of Advance Directive Out of facility DNR (pink MOST or yellow form)  Does patient want to make changes to medical advance directive? No - Patient declined  Would patient like information on creating a medical advance directive? -  Pre-existing out of facility DNR order (yellow form or pink MOST form) -     Chief Complaint  Patient presents with  . Acute Visit    Patient c/o Pain Management    HPI:  Pt is a 83 y.o. female seen today for an acute visit for Pain Management per request from her daughter. Patient has h/o parkinson disease, recurrent UTI, Syncope, orthostatic Hypotension, recurrent falls, CKD, GERD, hypertension and Hypothyroid.and recent C.Diff colitis. She is in the SNF for End of life care and is enrolled in Hospice. Nurses wanted me to see her as daughter was requesting Standing dose of Morphine as patient seems uncomfortable and not able to ask for her PRN. Patient unable to give any history. She seem comfortable lying in the bed.   Past Medical History:  Diagnosis Date  . GERD (gastroesophageal reflux disease)   . Hypotension   . Thyroid disease    Past Surgical  History:  Procedure Laterality Date  . ABDOMINAL HYSTERECTOMY    . HIP SURGERY  10/23/09   left hip partial replacement, kept socket and had ball and shaft replaced    No Known Allergies  Outpatient Encounter Medications as of 12/22/2016  Medication Sig  . Balsam [place]-Castor Oil (VENELEX) OINT Apply topically. Apply to sacrum and bilateral buttocks q shift every shift  . carbidopa-levodopa (SINEMET IR) 25-100 MG tablet Take 1 tablet by mouth at bedtime.  . haloperidol (HALDOL) 2 MG/ML solution Give 1 ml by mouth once an evening  . ibuprofen (ADVIL,MOTRIN) 200 MG tablet Take 400 mg by mouth 4 (four) times daily.  Marland Kitchen. morphine 20 MG/5ML solution Take 0.25 mg by mouth every 2 (two) hours as needed for pain.   Marland Kitchen. senna (SENOKOT) 8.6 MG TABS tablet Take 1 tablet by mouth 4 (four) times daily.  . sertraline (ZOLOFT) 50 MG tablet Take 100 mg by mouth daily.    No facility-administered encounter medications on file as of 12/22/2016.      Review of Systems  Unable to perform ROS: Dementia    Immunization History  Administered Date(s) Administered  . Influenza-Unspecified 10/31/2016  . Pneumococcal Polysaccharide-23 10/27/2005   Pertinent  Health Maintenance Due  Topic Date Due  . DEXA SCAN  12/27/2016 (Originally 07/19/1991)  . PNA vac Low Risk Adult (2 of 2 - PCV13) 12/27/2016 (Originally 10/28/2006)  . INFLUENZA VACCINE  Completed   Fall Risk  12/09/2016  Falls in the past year? No   Functional  Status Survey:    Vitals:   12/22/16 1237  BP: (!) 99/52  Pulse: 78  Resp: 20  Temp: 97.7 F (36.5 C)   There is no height or weight on file to calculate BMI. Physical Exam  Constitutional: She appears well-developed.  HENT:  Head: Normocephalic.  Mouth/Throat: Oropharynx is clear and moist.  Eyes: Pupils are equal, round, and reactive to light.  Neck: Neck supple.  Cardiovascular: Normal rate and normal heart sounds.  No murmur heard. Pulmonary/Chest: Effort normal and breath  sounds normal. No respiratory distress. She has no wheezes. She has no rales.  Abdominal: Soft. Bowel sounds are normal. She exhibits no distension. There is no tenderness.  Musculoskeletal:  Trace edema Bilateral.  Lymphadenopathy:    She has no cervical adenopathy.    Labs reviewed: No results for input(s): NA, K, CL, CO2, GLUCOSE, BUN, CREATININE, CALCIUM, MG, PHOS in the last 8760 hours. No results for input(s): AST, ALT, ALKPHOS, BILITOT, PROT, ALBUMIN in the last 8760 hours. No results for input(s): WBC, NEUTROABS, HGB, HCT, MCV, PLT in the last 8760 hours.  No results found for: HGBA1C No results found for: CHOL, HDL, LDLCALC, LDLDIRECT, TRIG, CHOLHDL  Significant Diagnostic Results in last 30 days:  No results found.  Assessment/Plan  Pain Management Will start her on Morphine 1 mg at 9 am and 9 pm to help her sleep . Will continue Zoloft. Also Continue Morphine PRN. Her anxiety and Psychosis is controlled on Haldol . Sinemet for tremors.    Family/ staff Communication:   Labs/tests ordered:   Total time spent in this patient care encounter was 25_ minutes; greater than 50% of the visit spent counseling patient, reviewing records , Labs and coordinating care for problems addressed at this encounter.

## 2016-12-24 ENCOUNTER — Non-Acute Institutional Stay (SKILLED_NURSING_FACILITY): Payer: Medicare Other | Admitting: Internal Medicine

## 2016-12-24 ENCOUNTER — Encounter: Payer: Self-pay | Admitting: Internal Medicine

## 2016-12-24 DIAGNOSIS — R52 Pain, unspecified: Secondary | ICD-10-CM | POA: Diagnosis not present

## 2016-12-24 DIAGNOSIS — R197 Diarrhea, unspecified: Secondary | ICD-10-CM | POA: Diagnosis not present

## 2016-12-24 DIAGNOSIS — A0472 Enterocolitis due to Clostridium difficile, not specified as recurrent: Secondary | ICD-10-CM

## 2016-12-24 NOTE — Progress Notes (Signed)
This is an acute visit.  Level care skilled.  [facility]ility [facility].  Chief complaint acute visit secondary to diarrhea-question C. difficile.  History of present illness.  Patient is a 83 year old female seen today for an episode of foul-smelling diarrhea.  She does have a history of C. difficile and recently completed vancomycin-diarrhea apparently did resolve but now appears to have recurred.  She also has a history of Parkinson's disease-recurrent UTI-syncope as well as orthostatic hypotension falls chronic kidney disease as well as GERD hypertension and hypothyroidism.  She is in skilled nursing for end-of-life care she is under the service.  Dr. Chales AbrahamsGupta did see her earlier this week for pain management and her morphine has been made routine twice a day continued as needed as needed.  Nursing staff this evening reported the diarrhea- vital signs are stable she is afebrile she could not really give any review of systems is not really verbalizing this evening  Past Medical History:  Diagnosis Date  . GERD (gastroesophageal reflux disease)   . Hypotension   . Thyroid disease         Past Surgical History:  Procedure Laterality Date  . ABDOMINAL HYSTERECTOMY    . HIP SURGERY  10/23/09   left hip partial replacement, kept socket and had ball and shaft replaced    No Known Allergies          Sig  . Balsam Peru-Castor Oil (VENELEX) OINT Apply topically. Apply to sacrum and bilateral buttocks q shift every shift  . carbidopa-levodopa (SINEMET IR) 25-100 MG tablet Take 1 tablet by mouth at bedtime.  . haloperidol (HALDOL) 2 MG/ML solution Give 1 ml by mouth once an evening  . ibuprofen (ADVIL,MOTRIN) 200 MG tablet Take 400 mg by mouth 4 (four) times daily.  Marland Kitchen. morphine 20 MG/5ML solution Take 0.25 mg by mouth every 2 (two) hours as needed for pain.  Also the first is also on morphine 1 mg twice daily at 9 AM at 9 PM   . senna (SENOKOT) 8.6 MG TABS tablet Take  1 tablet by mouth 4 (four) times daily.  . sertraline (ZOLOFT) 50 MG tablet Take 100 mg by mouth daily.          Review of Systems  Unable to perform ROS: Dementia        Immunization History  Administered Date(s) Administered  . Influenza-Unspecified 10/31/2016  . Pneumococcal Polysaccharide-23 10/27/2005       Pertinent  Health Maintenance Due  Topic Date Due  . DEXA SCAN  12/27/2016 (Originally 07/19/1991)  . PNA vac Low Risk Adult (2 of 2 - PCV13) 12/27/2016 (Originally 10/28/2006)  . INFLUENZA VACCINE  Completed   Fall Risk  12/09/2016  Falls in the past year?    Physical exam.  Temperature is 97.7 pulse 80 respirations 20 blood pressure 111/71 O2 saturation is 93% on room air visual history  General this is a frail elderly female who does not appear to be in any distress lying comfortably in bed she is not really speaking.  Her skin is warm and dry.  Oropharynx is clear mucous membranes fairly moist.  Chest is clear to auscultation with poor respiratory effort there is no labored breathing.  Heart is regular rate and rhythm without murmur gallop or rub he has minimal lower extremity edema bilaterally.  Abdomen is soft does not appear to be acutely tender there are positive bowel sounds.  Musculoskeletal has general frailty with trace edema lower extremities as well  as some edema of her hands bilaterally which is not new.  Neurologic difficult exam since she does not follow verbal commands she is alert and makes eye contact does not speak much.  Psych as noted above   Labs.  No recent labs noted secondary to patient comfort care-hospice status.  Assessment and plan.  1.-  Recurrent diarrhea suspicious for C. difficile- will obtain an updated C. difficile culture-with her history will restart vancomycin 125 mg every 6 hours for 10 days again will await C. difficile culture results and monitor clinically-she does not appear to be in any distress but  this will have to be watched.  2.  History of end-stage  life issues pain management morphine has been made routine twice a day continues with a as needed dose will monitor she appeared to be comfortable this evening but this will have to be watched.  [id number]

## 2016-12-25 ENCOUNTER — Encounter (HOSPITAL_COMMUNITY)
Admission: RE | Admit: 2016-12-25 | Discharge: 2016-12-25 | Disposition: A | Discharge status: Home / Self Care | Payer: Medicare Other | Source: Skilled Nursing Facility | Attending: Internal Medicine | Admitting: Internal Medicine

## 2016-12-25 DIAGNOSIS — A0472 Enterocolitis due to Clostridium difficile, not specified as recurrent: Secondary | ICD-10-CM | POA: Insufficient documentation

## 2016-12-25 LAB — C DIFFICILE QUICK SCREEN W PCR REFLEX
C Diff antigen: POSITIVE — AB
C Diff toxin: NEGATIVE

## 2016-12-25 LAB — CLOSTRIDIUM DIFFICILE BY PCR: CDIFFPCR: POSITIVE — AB

## 2017-01-27 DEATH — deceased
